# Patient Record
Sex: Male | Born: 1960 | Race: Black or African American | Hispanic: No | Marital: Single | Smoking: Current every day smoker
Health system: Southern US, Community
[De-identification: ages and names within clinical notes are randomized; demographics above are authoritative.]

## PROBLEM LIST (undated history)

## (undated) DIAGNOSIS — Z9289 Personal history of other medical treatment: Secondary | ICD-10-CM

## (undated) DIAGNOSIS — R011 Cardiac murmur, unspecified: Secondary | ICD-10-CM

## (undated) DIAGNOSIS — E119 Type 2 diabetes mellitus without complications: Secondary | ICD-10-CM

## (undated) DIAGNOSIS — D649 Anemia, unspecified: Secondary | ICD-10-CM

## (undated) HISTORY — PX: NO PAST SURGERIES: SHX2092

---

## 2003-10-20 ENCOUNTER — Emergency Department (HOSPITAL_COMMUNITY): Admission: AD | Admit: 2003-10-20 | Discharge: 2003-10-20 | Payer: Self-pay | Admitting: Family Medicine

## 2004-07-03 ENCOUNTER — Emergency Department (HOSPITAL_COMMUNITY): Admission: EM | Admit: 2004-07-03 | Discharge: 2004-07-03 | Payer: Self-pay | Admitting: Family Medicine

## 2016-04-14 ENCOUNTER — Encounter (HOSPITAL_COMMUNITY): Payer: Self-pay | Admitting: Emergency Medicine

## 2016-04-14 ENCOUNTER — Ambulatory Visit (HOSPITAL_COMMUNITY)
Admission: EM | Admit: 2016-04-14 | Discharge: 2016-04-14 | Disposition: A | Discharge status: Home / Self Care | Payer: BC Managed Care – PPO | Attending: Radiology | Admitting: Radiology

## 2016-04-14 DIAGNOSIS — M545 Low back pain, unspecified: Secondary | ICD-10-CM

## 2016-04-14 MED ORDER — DICLOFENAC SODIUM 75 MG PO TBEC: 75.0000 mg | DELAYED_RELEASE_TABLET | Freq: Two times a day (BID) | ORAL | 0 refills | Status: DC

## 2016-04-14 MED ORDER — CYCLOBENZAPRINE HCL 10 MG PO TABS: 10.0000 mg | ORAL_TABLET | Freq: Two times a day (BID) | ORAL | 0 refills | Status: AC | PRN

## 2016-04-14 NOTE — ED Triage Notes (Signed)
Pt c/o constant lower back pain onset 2 days  Reports he inj his back while trying to fix his car  Pain increases w/activity  Taking ibup w/no relief.   A&O x4... NAD

## 2016-04-14 NOTE — ED Provider Notes (Signed)
CSN: [id number]     Arrival date & time 04/14/16  1208 History   First MD Initiated Contact with Patient 04/14/16 1319     Chief Complaint  Patient presents with  . Back Pain   (Consider location/radiation/quality/duration/timing/severity/associated sxs/prior Treatment) 55 y.o. male presents with left sided lower back pain X 2 days.  Patient states that he injured his back while working on his car. Condition is acute on chronic in nature. Condition is made better by nothing Condition is made worse by movement. Patient denies any relief from OTC medications taken prior to there arrival at this facility. Patient states that he has a history of back pain. Patient denies any fevers or pain with urination or neurological deficit noted       History reviewed. No pertinent past medical history. History reviewed. No pertinent surgical history. History reviewed. No pertinent family history. Social History  Substance Use Topics  . Smoking status: Current Every Day Smoker  . Smokeless tobacco: Current User  . Alcohol use No    Review of Systems  Constitutional: Negative.   HENT: Negative.   Respiratory: Negative.   Cardiovascular: Negative.   Musculoskeletal: Positive for back pain ( left lower).    Allergies  Review of patient's allergies indicates no known allergies.  Home Medications   Prior to Admission medications   Not on File   Meds Ordered and Administered this Visit  Medications - No data to display  BP 144/84 (BP Location: Right Arm)   Pulse 114   Temp 97.8 F (36.6 C) (Oral)   Resp 16   SpO2 100%  No data found.   Physical Exam  Constitutional: He is oriented to person, place, and time. He appears well-developed and well-nourished.  Cardiovascular: Regular rhythm.   Pulmonary/Chest: Effort normal and breath sounds normal.  Musculoskeletal:  Left lower back pain worse with palpation  Neurological: He is alert and oriented to person, place, and time.     Urgent Care Course   Clinical Course    Procedures (including critical care time)  Labs Review Labs Reviewed - No data to display  Imaging Review No results found.   Visual Acuity Review  Right Eye Distance:   Left Eye Distance:   Bilateral Distance:    Right Eye Near:   Left Eye Near:    Bilateral Near:         MDM   1. Left-sided low back pain without sciatica      Jacqualine Mau, NP 04/14/16 1333

## 2016-04-14 NOTE — ED Notes (Signed)
Pt d/c by Anderson Malta, NP

## 2016-05-15 ENCOUNTER — Ambulatory Visit (HOSPITAL_COMMUNITY)
Admission: EM | Admit: 2016-05-15 | Discharge: 2016-05-15 | Disposition: A | Discharge status: Home / Self Care | Payer: BC Managed Care – PPO | Attending: Family Medicine | Admitting: Family Medicine

## 2016-05-15 ENCOUNTER — Encounter (HOSPITAL_COMMUNITY): Payer: Self-pay | Admitting: Emergency Medicine

## 2016-05-15 ENCOUNTER — Ambulatory Visit (INDEPENDENT_AMBULATORY_CARE_PROVIDER_SITE_OTHER): Payer: BC Managed Care – PPO

## 2016-05-15 DIAGNOSIS — M545 Low back pain: Secondary | ICD-10-CM | POA: Diagnosis not present

## 2016-05-15 DIAGNOSIS — T148 Other injury of unspecified body region: Secondary | ICD-10-CM

## 2016-05-15 DIAGNOSIS — M549 Dorsalgia, unspecified: Secondary | ICD-10-CM | POA: Diagnosis present

## 2016-05-15 DIAGNOSIS — Z79899 Other long term (current) drug therapy: Secondary | ICD-10-CM | POA: Insufficient documentation

## 2016-05-15 DIAGNOSIS — E119 Type 2 diabetes mellitus without complications: Secondary | ICD-10-CM | POA: Insufficient documentation

## 2016-05-15 DIAGNOSIS — M5136 Other intervertebral disc degeneration, lumbar region: Secondary | ICD-10-CM | POA: Insufficient documentation

## 2016-05-15 DIAGNOSIS — IMO0002 Reserved for concepts with insufficient information to code with codable children: Secondary | ICD-10-CM

## 2016-05-15 DIAGNOSIS — F17200 Nicotine dependence, unspecified, uncomplicated: Secondary | ICD-10-CM | POA: Insufficient documentation

## 2016-05-15 DIAGNOSIS — M4855XA Collapsed vertebra, not elsewhere classified, thoracolumbar region, initial encounter for fracture: Secondary | ICD-10-CM | POA: Diagnosis not present

## 2016-05-15 LAB — BASIC METABOLIC PANEL
ANION GAP: 11 (ref 5–15)
BUN: 19 mg/dL (ref 6–20)
CHLORIDE: 101 mmol/L (ref 101–111)
CO2: 26 mmol/L (ref 22–32)
Calcium: 10.4 mg/dL — ABNORMAL HIGH (ref 8.9–10.3)
Creatinine, Ser: 1.23 mg/dL (ref 0.61–1.24)
GFR calc Af Amer: >60 mL/min (ref >60)
GFR calc non Af Amer: >60 mL/min (ref >60)
GLUCOSE: 179 mg/dL — AB (ref 65–99)
POTASSIUM: 3.6 mmol/L (ref 3.5–5.1)
Sodium: 138 mmol/L (ref 135–145)

## 2016-05-15 LAB — POCT URINALYSIS DIP (DEVICE)
Glucose, UA: NEGATIVE mg/dL
HGB URINE DIPSTICK: NEGATIVE
KETONES UR: NEGATIVE mg/dL
Leukocytes, UA: NEGATIVE
Nitrite: NEGATIVE
PH: 5.5 (ref 5.0–8.0)
Protein, ur: 30 mg/dL — AB
SPECIFIC GRAVITY, URINE: 1.025 (ref 1.005–1.030)
Urobilinogen, UA: 4 mg/dL — ABNORMAL HIGH (ref 0.0–1.0)

## 2016-05-15 LAB — GLUCOSE, CAPILLARY: Glucose-Capillary: 171 mg/dL — ABNORMAL HIGH (ref 65–99)

## 2016-05-15 MED ORDER — OXYCODONE-ACETAMINOPHEN 10-325 MG PO TABS: 1.0000 | ORAL_TABLET | ORAL | 0 refills | Status: DC | PRN

## 2016-05-15 NOTE — ED Triage Notes (Signed)
The patient presented to the [facility] with a complaint of recurring lower back pain. The patient stated that he was treated at the [facility] on 04/14/2016 for the same.

## 2016-05-15 NOTE — ED Provider Notes (Signed)
CSN: [id number]     Arrival date & time 05/15/16  1403 History   First MD Initiated Contact with Patient 05/15/16 1508     Chief Complaint  Patient presents with  . Back Pain   (Consider location/radiation/quality/duration/timing/severity/associated sxs/prior Treatment) HPI 55 y/o male with chronic back pain, was seen in the UC one month ago, but continues to c/o pain.. No neuro symptoms. Does not have a PCP. Also asked about his diabetes. States he was taking medications but has been without meds by his report for about 2 years. States he has new weight loss and increase urination.  Past Medical History:  Diagnosis Date  . Diabetes mellitus without complication ([place])    History reviewed. No pertinent surgical history. History reviewed. No pertinent family history. Social History  Substance Use Topics  . Smoking status: Current Every Day Smoker  . Smokeless tobacco: Current User  . Alcohol use No    Review of Systems  Denies: HEADACHE, NAUSEA, ABDOMINAL PAIN, CHEST PAIN, CONGESTION, DYSURIA, SHORTNESS OF BREATH  Allergies  Review of patient's allergies indicates no known allergies.  Home Medications   Prior to Admission medications   Medication Sig Start Date End Date Taking? Authorizing Provider  cyclobenzaprine (FLEXERIL) 10 MG tablet Take 1 tablet (10 mg total) by mouth 2 (two) times daily as needed for muscle spasms. 04/14/16  Yes Jacqualine Mau, NP  diclofenac (VOLTAREN) 75 MG EC tablet Take 1 tablet (75 mg total) by mouth 2 (two) times daily. 04/14/16  Yes Jacqualine Mau, NP   Meds Ordered and Administered this Visit  Medications - No data to display  BP 128/69 (BP Location: Left Arm)   Pulse (!) 122 Comment: rn notified  Temp 98.4 F (36.9 C)   Resp 14   SpO2 100%  No data found.   Physical Exam NURSES NOTES AND VITAL SIGNS REVIEWED. CONSTITUTIONAL: Well developed, well nourished, no acute distress HEENT: normocephalic, atraumatic EYES:  Conjunctiva normal NECK:normal ROM, supple, no adenopathy PULMONARY:No respiratory distress, normal effort ABDOMINAL: Soft, ND, NT BS+, No CVAT MUSCULOSKELETAL: Normal ROM of all extremities, diffuse tenderness along lumbar and thoracic spine. Neg SLR. Sensory and motor intact distally.  SKIN: warm and dry without rash PSYCHIATRIC: Mood and affect, behavior are normal  Urgent Care Course   Clinical Course    Procedures (including critical care time)  Labs Review Labs Reviewed  POCT URINALYSIS DIP (DEVICE) - Abnormal; Notable for the following:       Result Value   Bilirubin Urine SMALL (*)    Protein, ur 30 (*)    Urobilinogen, UA 4.0 (*)    All other components within normal limits  HEMOGLOBIN [id number]  BASIC METABOLIC PANEL    Imaging Review Dg Lumbar Spine Complete  Result Date: 05/15/2016 CLINICAL DATA:  Patient with lower back pain after twisting injury 1 month prior. Initial encounter. EXAM: LUMBAR SPINE - COMPLETE 4+ VIEW COMPARISON:  None. FINDINGS: Multilevel degenerative disc disease with bulky anterior endplate osteophytosis. Multilevel facet degenerative changes. Age-indeterminate compression deformities of the superior endplate of the L1 and 624THL vertebral bodies. SI joints are unremarkable. Stool throughout the colon. IMPRESSION: Multilevel degenerative disc and facet disease. Age-indeterminate superior endplate compression deformities of the T12 and L1 vertebral bodies. Recommend correlation for point tenderness. These results will be called to the ordering clinician or representative by the Radiologist Assistant, and communication documented in the PACS or zVision Dashboard. Electronically Signed   By: Lovey Newcomer M.D.   On: 05/15/2016  15:54   Discussed with pt at discharge.   Visual Acuity Review  Right Eye Distance:   Left Eye Distance:   Bilateral Distance:    Right Eye Near:   Left Eye Near:    Bilateral Near:       Ortho referral   MDM   1. Low back  pain without sciatica, unspecified back pain laterality   2. Compression fracture     Patient is reassured that there are no issues that require transfer to higher level of care at this time or additional tests. Patient is advised to continue home symptomatic treatment. Patient is advised that if there are new or worsening symptoms to attend the emergency department, contact primary care provider, or return to UC. Instructions of care provided discharged home in stable condition.    THIS NOTE WAS GENERATED USING A VOICE RECOGNITION SOFTWARE PROGRAM. ALL REASONABLE EFFORTS  WERE MADE TO PROOFREAD THIS DOCUMENT FOR ACCURACY.  I have verbally reviewed the discharge instructions with the patient. A printed AVS was given to the patient.  All questions were answered prior to discharge.      Konrad Felix, PA 05/16/16 1921

## 2016-05-16 LAB — HEMOGLOBIN A1C
HEMOGLOBIN A1C: 6.7 % — AB (ref 4.8–5.6)
MEAN PLASMA GLUCOSE: 146 mg/dL

## 2016-05-28 ENCOUNTER — Telehealth (HOSPITAL_COMMUNITY): Payer: Self-pay | Admitting: Emergency Medicine

## 2016-05-28 NOTE — Telephone Encounter (Signed)
-----   Message from Sherlene Shams, MD sent at 05/23/2016  2:44 PM EDT ----- Clinical staff, please let patient know that HgbA1c was slightly elevated at 6.7.   Followup with a primary care provider to discuss further management of elevated blood glucose and HbA1c.  LM

## 2016-05-28 NOTE — Telephone Encounter (Signed)
LM on pt's VM Need to see how pt is doing and to give lab results from recent visit on 9/26 Also let pt know labs can be obtained from [facility]

## 2016-05-29 ENCOUNTER — Emergency Department (HOSPITAL_COMMUNITY): Payer: BC Managed Care – PPO

## 2016-05-29 ENCOUNTER — Inpatient Hospital Stay (HOSPITAL_COMMUNITY)
Admission: EM | Admit: 2016-05-29 | Discharge: 2016-06-02 | DRG: 723 | Disposition: A | Discharge status: Home Health | Payer: BC Managed Care – PPO | Attending: Internal Medicine | Admitting: Internal Medicine

## 2016-05-29 ENCOUNTER — Inpatient Hospital Stay (HOSPITAL_COMMUNITY): Payer: BC Managed Care – PPO

## 2016-05-29 ENCOUNTER — Encounter (HOSPITAL_COMMUNITY): Payer: Self-pay | Admitting: Emergency Medicine

## 2016-05-29 DIAGNOSIS — Z79899 Other long term (current) drug therapy: Secondary | ICD-10-CM | POA: Diagnosis not present

## 2016-05-29 DIAGNOSIS — Z9289 Personal history of other medical treatment: Secondary | ICD-10-CM

## 2016-05-29 DIAGNOSIS — J9811 Atelectasis: Secondary | ICD-10-CM | POA: Diagnosis present

## 2016-05-29 DIAGNOSIS — D649 Anemia, unspecified: Secondary | ICD-10-CM | POA: Diagnosis not present

## 2016-05-29 DIAGNOSIS — D630 Anemia in neoplastic disease: Secondary | ICD-10-CM | POA: Diagnosis present

## 2016-05-29 DIAGNOSIS — R61 Generalized hyperhidrosis: Secondary | ICD-10-CM | POA: Diagnosis not present

## 2016-05-29 DIAGNOSIS — R739 Hyperglycemia, unspecified: Secondary | ICD-10-CM

## 2016-05-29 DIAGNOSIS — M549 Dorsalgia, unspecified: Secondary | ICD-10-CM | POA: Diagnosis present

## 2016-05-29 DIAGNOSIS — C801 Malignant (primary) neoplasm, unspecified: Secondary | ICD-10-CM | POA: Diagnosis not present

## 2016-05-29 DIAGNOSIS — F17200 Nicotine dependence, unspecified, uncomplicated: Secondary | ICD-10-CM | POA: Diagnosis present

## 2016-05-29 DIAGNOSIS — M8458XA Pathological fracture in neoplastic disease, other specified site, initial encounter for fracture: Secondary | ICD-10-CM | POA: Diagnosis present

## 2016-05-29 DIAGNOSIS — C7951 Secondary malignant neoplasm of bone: Secondary | ICD-10-CM | POA: Diagnosis present

## 2016-05-29 DIAGNOSIS — R748 Abnormal levels of other serum enzymes: Secondary | ICD-10-CM | POA: Diagnosis present

## 2016-05-29 DIAGNOSIS — R17 Unspecified jaundice: Secondary | ICD-10-CM | POA: Diagnosis not present

## 2016-05-29 DIAGNOSIS — N390 Urinary tract infection, site not specified: Secondary | ICD-10-CM | POA: Diagnosis present

## 2016-05-29 DIAGNOSIS — C799 Secondary malignant neoplasm of unspecified site: Secondary | ICD-10-CM | POA: Diagnosis not present

## 2016-05-29 DIAGNOSIS — M545 Low back pain: Secondary | ICD-10-CM | POA: Diagnosis present

## 2016-05-29 DIAGNOSIS — C775 Secondary and unspecified malignant neoplasm of intrapelvic lymph nodes: Secondary | ICD-10-CM | POA: Diagnosis not present

## 2016-05-29 DIAGNOSIS — Z833 Family history of diabetes mellitus: Secondary | ICD-10-CM

## 2016-05-29 DIAGNOSIS — E876 Hypokalemia: Secondary | ICD-10-CM | POA: Diagnosis present

## 2016-05-29 DIAGNOSIS — R8271 Bacteriuria: Secondary | ICD-10-CM

## 2016-05-29 DIAGNOSIS — Z6829 Body mass index (BMI) 29.0-29.9, adult: Secondary | ICD-10-CM

## 2016-05-29 DIAGNOSIS — C779 Secondary and unspecified malignant neoplasm of lymph node, unspecified: Secondary | ICD-10-CM | POA: Diagnosis present

## 2016-05-29 DIAGNOSIS — E1165 Type 2 diabetes mellitus with hyperglycemia: Secondary | ICD-10-CM | POA: Diagnosis present

## 2016-05-29 DIAGNOSIS — R634 Abnormal weight loss: Secondary | ICD-10-CM | POA: Diagnosis present

## 2016-05-29 DIAGNOSIS — C7949 Secondary malignant neoplasm of other parts of nervous system: Secondary | ICD-10-CM | POA: Diagnosis present

## 2016-05-29 DIAGNOSIS — M899 Disorder of bone, unspecified: Secondary | ICD-10-CM | POA: Diagnosis not present

## 2016-05-29 DIAGNOSIS — C61 Malignant neoplasm of prostate: Secondary | ICD-10-CM | POA: Diagnosis present

## 2016-05-29 DIAGNOSIS — I10 Essential (primary) hypertension: Secondary | ICD-10-CM | POA: Diagnosis present

## 2016-05-29 DIAGNOSIS — E119 Type 2 diabetes mellitus without complications: Secondary | ICD-10-CM | POA: Diagnosis not present

## 2016-05-29 DIAGNOSIS — F17210 Nicotine dependence, cigarettes, uncomplicated: Secondary | ICD-10-CM

## 2016-05-29 HISTORY — DX: Personal history of other medical treatment: Z92.89

## 2016-05-29 HISTORY — DX: Cardiac murmur, unspecified: R01.1

## 2016-05-29 HISTORY — DX: Anemia, unspecified: D64.9

## 2016-05-29 HISTORY — DX: Type 2 diabetes mellitus without complications: E11.9

## 2016-05-29 LAB — PSA: PSA: 1471 ng/mL — ABNORMAL HIGH (ref 0.00–4.00)

## 2016-05-29 LAB — PREPARE RBC (CROSSMATCH)

## 2016-05-29 LAB — I-STAT VENOUS BLOOD GAS, ED
Acid-Base Excess: 1 mmol/L (ref 0.0–2.0)
BICARBONATE: 25 mmol/L (ref 20.0–28.0)
O2 SAT: 96 %
PCO2 VEN: 34.9 mmHg — AB (ref 44.0–60.0)
PO2 VEN: 79 mmHg — AB (ref 32.0–45.0)
TCO2: 26 mmol/L (ref 0–100)
pH, Ven: 7.464 — ABNORMAL HIGH (ref 7.250–7.430)

## 2016-05-29 LAB — URINALYSIS, ROUTINE W REFLEX MICROSCOPIC
Glucose, UA: NEGATIVE mg/dL
HGB URINE DIPSTICK: NEGATIVE
Ketones, ur: 15 mg/dL — AB
Leukocytes, UA: NEGATIVE
NITRITE: POSITIVE — AB
Protein, ur: 30 mg/dL — AB
SPECIFIC GRAVITY, URINE: 1.017 (ref 1.005–1.030)
pH: 5.5 (ref 5.0–8.0)

## 2016-05-29 LAB — CBC WITH DIFFERENTIAL/PLATELET
BASOS ABS: 0.2 10*3/uL — AB (ref 0.0–0.1)
BASOS ABS: 0 10*3/uL (ref 0.0–0.1)
BASOS PCT: 1 %
BLASTS: 0 %
Band Neutrophils: 0 %
Basophils Relative: 0 %
EOS PCT: 0 %
Eosinophils Absolute: 0 10*3/uL (ref 0.0–0.7)
Eosinophils Absolute: 0.2 10*3/uL (ref 0.0–0.7)
Eosinophils Relative: 1 %
HEMATOCRIT: 15.3 % — AB (ref 39.0–52.0)
HEMATOCRIT: 14.7 % — AB (ref 39.0–52.0)
HEMOGLOBIN: 4.6 g/dL — AB (ref 13.0–17.0)
HEMOGLOBIN: 4.4 g/dL — AB (ref 13.0–17.0)
LYMPHS ABS: 4 10*3/uL (ref 0.7–4.0)
LYMPHS PCT: 27 %
Lymphocytes Relative: 26 %
Lymphs Abs: 4.3 10*3/uL — ABNORMAL HIGH (ref 0.7–4.0)
MCH: 27.5 pg (ref 26.0–34.0)
MCH: 27.5 pg (ref 26.0–34.0)
MCHC: 30.1 g/dL (ref 30.0–36.0)
MCHC: 29.9 g/dL — ABNORMAL LOW (ref 30.0–36.0)
MCV: 91.6 fL (ref 78.0–100.0)
MCV: 91.9 fL (ref 78.0–100.0)
METAMYELOCYTES PCT: 0 %
MONOS PCT: 5 %
MYELOCYTES: 0 %
Monocytes Absolute: 0.8 10*3/uL (ref 0.1–1.0)
Monocytes Absolute: 0.5 10*3/uL (ref 0.1–1.0)
Monocytes Relative: 3 %
NEUTROS ABS: 10.5 10*3/uL — AB (ref 1.7–7.7)
Neutro Abs: 10.5 10*3/uL — ABNORMAL HIGH (ref 1.7–7.7)
Neutrophils Relative %: 67 %
Neutrophils Relative %: 70 %
Other: 0 %
Platelets: 157 10*3/uL (ref 150–400)
Platelets: 142 10*3/uL — ABNORMAL LOW (ref 150–400)
Promyelocytes Absolute: 0 %
RBC: 1.67 MIL/uL — ABNORMAL LOW (ref 4.22–5.81)
RBC: 1.6 MIL/uL — AB (ref 4.22–5.81)
RDW: 16.5 % — ABNORMAL HIGH (ref 11.5–15.5)
RDW: 16.7 % — AB (ref 11.5–15.5)
WBC: 15.8 10*3/uL — ABNORMAL HIGH (ref 4.0–10.5)
WBC: 15.2 10*3/uL — AB (ref 4.0–10.5)
nRBC: 12 /100 WBC — ABNORMAL HIGH

## 2016-05-29 LAB — URINE MICROSCOPIC-ADD ON: WBC UA: NONE SEEN WBC/hpf (ref 0–5)

## 2016-05-29 LAB — COMPREHENSIVE METABOLIC PANEL
ALBUMIN: 2.8 g/dL — AB (ref 3.5–5.0)
ALT: 13 U/L — ABNORMAL LOW (ref 17–63)
ANION GAP: 12 (ref 5–15)
AST: 15 U/L (ref 15–41)
Alkaline Phosphatase: 1156 U/L — ABNORMAL HIGH (ref 38–126)
BUN: 15 mg/dL (ref 6–20)
CHLORIDE: 98 mmol/L — AB (ref 101–111)
CO2: 25 mmol/L (ref 22–32)
Calcium: 8.7 mg/dL — ABNORMAL LOW (ref 8.9–10.3)
Creatinine, Ser: 1.23 mg/dL (ref 0.61–1.24)
GFR calc Af Amer: >60 mL/min (ref >60)
GFR calc non Af Amer: >60 mL/min (ref >60)
GLUCOSE: 205 mg/dL — AB (ref 65–99)
POTASSIUM: 3.4 mmol/L — AB (ref 3.5–5.1)
SODIUM: 135 mmol/L (ref 135–145)
TOTAL PROTEIN: 6.9 g/dL (ref 6.5–8.1)
Total Bilirubin: 1.7 mg/dL — ABNORMAL HIGH (ref 0.3–1.2)

## 2016-05-29 LAB — RETICULOCYTES
RBC.: 1.55 MIL/uL — AB (ref 4.22–5.81)
RETIC CT PCT: 8.8 % — AB (ref 0.4–3.1)
Retic Count, Absolute: 136.4 10*3/uL (ref 19.0–186.0)

## 2016-05-29 LAB — RAPID HIV SCREEN (HIV 1/2 AB+AG)
HIV 1/2 ANTIBODIES: NONREACTIVE
HIV-1 P24 ANTIGEN - HIV24: NONREACTIVE

## 2016-05-29 LAB — IRON AND TIBC
IRON: 74 ug/dL (ref 45–182)
SATURATION RATIOS: 32 % (ref 17.9–39.5)
TIBC: 228 ug/dL — AB (ref 250–450)
UIBC: 154 ug/dL

## 2016-05-29 LAB — VITAMIN B12: Vitamin B-12: 298 pg/mL (ref 180–914)

## 2016-05-29 LAB — I-STAT CG4 LACTIC ACID, ED: LACTIC ACID, VENOUS: 2.29 mmol/L — AB (ref 0.5–1.9)

## 2016-05-29 LAB — ABO/RH: ABO/RH(D): B POS

## 2016-05-29 LAB — LACTATE DEHYDROGENASE: LDH: 231 U/L — ABNORMAL HIGH (ref 98–192)

## 2016-05-29 LAB — LACTIC ACID, PLASMA: LACTIC ACID, VENOUS: 1.5 mmol/L (ref 0.5–1.9)

## 2016-05-29 LAB — FOLATE: Folate: 4.6 ng/mL — ABNORMAL LOW (ref >5.9)

## 2016-05-29 LAB — POC OCCULT BLOOD, ED: Fecal Occult Bld: NEGATIVE

## 2016-05-29 LAB — SAVE SMEAR

## 2016-05-29 LAB — LIPASE, BLOOD: LIPASE: 21 U/L (ref 11–51)

## 2016-05-29 LAB — GLUCOSE, CAPILLARY: Glucose-Capillary: 176 mg/dL — ABNORMAL HIGH (ref 65–99)

## 2016-05-29 MED ORDER — ACETAMINOPHEN 325 MG PO TABS: 650.0000 mg | ORAL_TABLET | Freq: Four times a day (QID) | ORAL | Status: DC | PRN

## 2016-05-29 MED ORDER — SODIUM CHLORIDE 0.9% FLUSH
3.0000 mL | Freq: Two times a day (BID) | INTRAVENOUS | Status: DC
  Administered 2016-05-29 – 2016-06-02 (×7): 3 mL via INTRAVENOUS

## 2016-05-29 MED ORDER — DEXTROSE 5 % IV SOLN
1.0000 g | Freq: Once | INTRAVENOUS | Status: AC
  Administered 2016-05-29: 1 g via INTRAVENOUS
  Filled 2016-05-29: qty 10

## 2016-05-29 MED ORDER — SODIUM CHLORIDE 0.9 % IV SOLN: 10.0000 mL/h | Freq: Once | INTRAVENOUS | Status: DC

## 2016-05-29 MED ORDER — SODIUM CHLORIDE 0.9 % IV SOLN
Freq: Once | INTRAVENOUS | Status: AC
  Administered 2016-05-29: 17:00:00 via INTRAVENOUS

## 2016-05-29 MED ORDER — IOPAMIDOL (ISOVUE-300) INJECTION 61%
INTRAVENOUS | Status: AC
  Administered 2016-05-29: 100 mL
  Filled 2016-05-29: qty 100

## 2016-05-29 MED ORDER — ACETAMINOPHEN 650 MG RE SUPP: 650.0000 mg | Freq: Four times a day (QID) | RECTAL | Status: DC | PRN

## 2016-05-29 MED ORDER — SODIUM CHLORIDE 0.9 % IV BOLUS (SEPSIS)
1000.0000 mL | Freq: Once | INTRAVENOUS | Status: AC
  Administered 2016-05-29: 1000 mL via INTRAVENOUS

## 2016-05-29 MED ORDER — POTASSIUM CHLORIDE CRYS ER 20 MEQ PO TBCR
20.0000 meq | EXTENDED_RELEASE_TABLET | Freq: Once | ORAL | Status: AC
  Administered 2016-05-29: 20 meq via ORAL
  Filled 2016-05-29: qty 1

## 2016-05-29 MED ORDER — DEXTROSE 5 % IV SOLN
1.0000 g | INTRAVENOUS | Status: DC
  Administered 2016-05-30 – 2016-05-31 (×2): 1 g via INTRAVENOUS
  Filled 2016-05-29 (×5): qty 10

## 2016-05-29 MED ORDER — ENSURE ENLIVE PO LIQD
237.0000 mL | Freq: Two times a day (BID) | ORAL | Status: DC
  Administered 2016-05-30 – 2016-06-02 (×6): 237 mL via ORAL

## 2016-05-29 MED ORDER — HYDROCODONE-ACETAMINOPHEN 5-325 MG PO TABS
1.0000 | ORAL_TABLET | ORAL | Status: DC | PRN
Start: 2016-05-29 — End: 2016-06-02
  Administered 2016-05-31 – 2016-06-02 (×5): 2 via ORAL
  Filled 2016-05-29 (×5): qty 2

## 2016-05-29 NOTE — H&P (Signed)
Date: 05/29/2016               Patient Name:  Cameron Marshall MRN: [id number]  DOB: 02/28/1961 Age / Sex: 56 y.o., male   PCP: Pcp Not In System         Medical Service: Internal Medicine Teaching Service         Attending Physician: Dr. Sid Falcon, MD    First Contact: Dr. Reesa Chew Pager: [phone]  Second Contact: Dr. Juleen China Pager: [phone]       After Hours (After 5p/  First Contact Pager: [phone]  weekends / holidays): Second Contact Pager: [phone]   Chief Complaint: Lower back pain for 3 weeks.  History of Present Illness: Cameron Marshall 55 y.o man, without any significant past medical history, presented to the history of worsening lower back pain. He states that pain started about 3 weeks ago when he was putting some heavy stuff in his truck. He attributes that pain to be a muscle pull, never took any medical attention at that time. He was seen in an urgent care on September 26, because of his back pain, lumbar spinal x-ray done during that time shows multilevel degenerative disc and facet disease with compression deformities of the T12 and L1. He was seen at an urgent care on August 26, with left-sided lower back pain radiating to his leg, and was given some Flexeril and diclofenac, no imaging performed during that time. He endorse some night sweats and subjective fever for the last 3-4 days. He denies any nausea and vomiting, but states that he come nauseated after taking Percocet. He do endorse decrease in his appetite and unintentional weight loss of about 30 pounds over the last 2-3 month. He states that he has notice mild reddish discoloration of his urine for the last few  months. He has some difficulty with urination, poor stream, hesitancy and nocturia for about a year. He denies any change in his bowel movement or blood in his stool. He denies any shortness of breath, palpitation, or chest pain. He denies any headaches or change in vision.   Meds:  Current Meds    Medication Sig  . cyclobenzaprine (FLEXERIL) 10 MG tablet Take 1 tablet (10 mg total) by mouth 2 (two) times daily as needed for muscle spasms.  . diclofenac (VOLTAREN) 75 MG EC tablet Take 1 tablet (75 mg total) by mouth 2 (two) times daily.     Allergies: Allergies as of 05/29/2016  . (No Known Allergies)   Past Medical History:  Diagnosis Date  . Diabetes mellitus without complication ([place])     Family History: Mother is diabetic.  Social History: Smoker since the age 68, 30 pack/wk                          Denies any alcohol or illicit drug use.  Review of Systems: A complete ROS was negative except as per HPI.   Physical Exam: Blood pressure 135/70, pulse 109, temperature 98.3 F (36.8 C), temperature source Oral, resp. rate 18, height '5\' 9"'  (1.753 m), SpO2 100 %. Vitals:   05/29/16 1100 05/29/16 1115 05/29/16 1130 05/29/16 1145  BP: 132/64 135/65 (!) 138/127 135/70  Pulse: (!) 122 112 97 109  Resp: '14 22 19 18  ' Temp:      TempSrc:      SpO2: 99% 100% 100% 100%  Height:       General: Vital signs reviewed.  Patient is well-developed and well-nourished, in no acute distress and cooperative with exam.  Head: Normocephalic and atraumatic. Eyes: EOMI, conjunctival Pallor, mild scleral icterus.  Neck: Supple, trachea midline, normal ROM, no JVD, masses, thyromegaly, or carotid bruit present.  Cardiovascular: RRR, S1 normal, S2 normal, no murmurs, gallops, or rubs. Pulmonary/Chest: Clear to auscultation bilaterally, no wheezes, rales, or rhonchi. Abdominal: Soft, non-tender, non-distended, BS +, no masses, organomegaly, or guarding present.  Rectal. Enlarged prostate, no nodularity noted. Rectal tone was normal. Musculoskeletal: No joint deformities, erythema, or stiffness, ROM full and nontender. Extremities: No lower extremity edema bilaterally,  pulses symmetric and intact bilaterally. No cyanosis or clubbing. Neurological: A&O x3, Strength is normal and symmetric  bilaterally, cranial nerve II-XII are grossly intact, no focal motor deficit, sensory intact to light touch bilaterally.  Skin: Warm, dry and intact. No rashes or erythema. Psychiatric: Normal mood and affect. speech and behavior is normal. Cognition and memory are normal.   Labs. CBC    Component Value Date/Time   WBC 15.2 (H) 05/29/2016 1221   RBC 1.55 (L) 05/29/2016 1513   RBC 1.60 (L) 05/29/2016 1221   HGB 4.4 (LL) 05/29/2016 1221   HCT 14.7 (L) 05/29/2016 1221   PLT 142 (L) 05/29/2016 1221   MCV 91.9 05/29/2016 1221   MCH 27.5 05/29/2016 1221   MCHC 29.9 (L) 05/29/2016 1221   RDW 16.7 (H) 05/29/2016 1221   LYMPHSABS 4.0 05/29/2016 1221   MONOABS 0.5 05/29/2016 1221   EOSABS 0.2 05/29/2016 1221   BASOSABS 0.0 05/29/2016 1221   CMP Latest Ref Rng & Units 05/29/2016 05/15/2016  Glucose 65 - 99 mg/dL 205(H) 179(H)  BUN 6 - 20 mg/dL 15 19  Creatinine 0.61 - 1.24 mg/dL 1.23 1.23  Sodium 135 - 145 mmol/L 135 138  Potassium 3.5 - 5.1 mmol/L 3.4(L) 3.6  Chloride 101 - 111 mmol/L 98(L) 101  CO2 22 - 32 mmol/L 25 26  Calcium 8.9 - 10.3 mg/dL 8.7(L) 10.4(H)  Total Protein 6.5 - 8.1 g/dL 6.9 -  Total Bilirubin 0.3 - 1.2 mg/dL 1.7(H) -  Alkaline Phos 38 - 126 U/L 1,156(H) -  AST 15 - 41 U/L 15 -  ALT 17 - 63 U/L 13(L) -   Retic Ct Pct.  8.8  FOBT. Negative  Lipase  21  Urinalysis    Component Value Date/Time   COLORURINE ORANGE (A) 05/29/2016 1138   APPEARANCEUR HAZY (A) 05/29/2016 1138   LABSPEC 1.017 05/29/2016 1138   PHURINE 5.5 05/29/2016 1138   GLUCOSEU NEGATIVE 05/29/2016 1138   HGBUR NEGATIVE 05/29/2016 1138   BILIRUBINUR SMALL (A) 05/29/2016 1138   KETONESUR 15 (A) 05/29/2016 1138   PROTEINUR 30 (A) 05/29/2016 1138   UROBILINOGEN 4.0 (H) 05/15/2016 1532   NITRITE POSITIVE (A) 05/29/2016 1138   LEUKOCYTESUR NEGATIVE 05/29/2016 1138    CT lumbar spine wo contrast. IMPRESSION: 1. Diffusely abnormal visible spine with pathologic fractures throughout the  visible lower thoracic spine and involving L1 and L2. Lytic lesions in the L4 and L5 vertebral bodies. Mixed lytic and sclerotic lesions throughout the visible sacrum. 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 2. Bulky prevertebral, retroperitoneal, and left pelvic sidewall lymphadenopathy/tumor. Partially visible prostate appears enlarged. The partially visible kidneys do not appear obstructed. 3. 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 The leading differential considerations are Lymphoma, Prostate cancer, and less likely Multiple Myeloma or other primary. Follow-up CT Chest Abdomen and Pelvis with IV and oral contrast  probably is the best next step to evaluate further.    Assessment & Plan by Problem:  Cameron Marshall 54 y.o man, without any significant past medical history, presented to the history of worsening lower back pain.  Malignancy. 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 Primary unknown at this time. Lymphoma and prostate cancer among our top differentials. Multiple myeloma or colon cancer can be a possibility, but less likely. His FOBT is negative and he do not have any family history. -Peripheral smear exam. -PSA -Kappa/Lamda light chains -Immunofixation electrophoresis. -Protein electrophoresis. -CT of his chest ,abdomen and pelvis with contrast. -Bone marrow if needed.  Anemia. He was found to have hemoglobin of 4.4. He denies any shortness of breath, exertional dyspnea, palpitation or chest pain. Most probably due to his malignancy, possible lymphoma, as his RBC and platelets both are decreased and he is having leukocytosis of 15.2. -Packed RBC -2 units -Repeat CBC in the morning.  UTI. He do complained of some dark-colored urine, but denies any dysuria or burning. He has some urgency and hesitancy and difficulty with initiation. His  urine analysis was positive for nitrates. -Culture and sensitivity -Rocephin 1 g daily -We will modify according to his sensitivity report.  High blood sugar. He was found to have mildly elevated blood sugars during the his recent visit to urgent care in ED.A1c done on 05/15/16 was 6.7. -CBG every 4 hourly. -We will add sliding scale if his blood sugar persistently stays above 140.  Hypokalemia. His potassium was 3.4. We will replace it. -Repeat BMP tomorrow  Code Status. Full DVT. SCD Diet. Low-carb.  Dispo: Admit patient to Inpatient with expected length of stay greater than 2 midnights.  Signed: Lorella Nimrod, MD 05/29/2016, 1:56 PM  Pager: [phone]

## 2016-05-29 NOTE — ED Triage Notes (Addendum)
Pt reports back pain for 2 weeks. Take oxy 1 Q 4 and no relief. Never seen MD for it. Lower back. Primary care MD says he has "some stuff that's not aligned". Having difficulty ambulating due to pain. Pulse is high.

## 2016-05-29 NOTE — ED Notes (Signed)
Blood consent signed

## 2016-05-29 NOTE — ED Provider Notes (Signed)
[facility] DEPT Provider Note   CSN: [id number] Arrival date & time: 05/29/16  7673     History   Chief Complaint Chief Complaint  Patient presents with  . Back Pain    HPI Cameron Marshall is a 55 y.o. male.  HPI  Patient is a 55 year old male presenting with back pain, weight loss, and nausea and decreased appetite and dark urine.. Patient has lost about 40 pounds last several months. He's been having increasing aching in his back. He's been to urgent care twice for back pain and was found to have compression fractures of unknown time. Patient has history of diabetes does not take any medications.  Has no follow-up, no PCP.  Past Medical History:  Diagnosis Date  . Diabetes mellitus without complication [facility])     Patient Active Problem List   Diagnosis Date Noted  . Anemia 05/29/2016  . Bone lesion 05/29/2016  . Elevated serum alkaline phosphatase level 05/29/2016  . Urinary tract infection 05/29/2016    History reviewed. No pertinent surgical history.     Home Medications    Prior to Admission medications   Medication Sig Start Date End Date Taking? Authorizing Provider  cyclobenzaprine (FLEXERIL) 10 MG tablet Take 1 tablet (10 mg total) by mouth 2 (two) times daily as needed for muscle spasms. 04/14/16  Yes Jacqualine Mau, NP  diclofenac (VOLTAREN) 75 MG EC tablet Take 1 tablet (75 mg total) by mouth 2 (two) times daily. 04/14/16  Yes Jacqualine Mau, NP  oxyCODONE-acetaminophen (PERCOCET) 10-325 MG tablet Take 1 tablet by mouth every 4 (four) hours as needed for pain. Patient not taking: Reported on 05/29/2016 05/15/16   Konrad Felix, PA    Family History History reviewed. No pertinent family history.  Social History Social History  Substance Use Topics  . Smoking status: Current Every Day Smoker    Packs/day: 1.00  . Smokeless tobacco: Current User  . Alcohol use No     Allergies   Review of patient's allergies indicates no known  allergies.   Review of Systems Review of Systems  Constitutional: Positive for appetite change. Negative for activity change.  Respiratory: Negative for shortness of breath.   Cardiovascular: Negative for chest pain.  Gastrointestinal: Positive for nausea. Negative for abdominal pain and vomiting.  Genitourinary: Positive for decreased urine volume. Negative for dysuria.  Musculoskeletal: Positive for back pain.  All other systems reviewed and are negative.    Physical Exam Updated Vital Signs BP 135/70   Pulse 109   Temp 98.3 F (36.8 C) (Oral)   Resp 18   Ht _0  (1.753 m)   SpO2 100%   Physical Exam  Constitutional: He is oriented to person, place, and time. He appears well-nourished.  HENT:  Head: Normocephalic.  Mild icterus.  Eyes: Conjunctivae are normal.  Cardiovascular:  Tachycardia and no murmurs.  Pulmonary/Chest: Effort normal and breath sounds normal.  Abdominal: He exhibits no distension. There is no tenderness.  Musculoskeletal:  Tenderness along L-spine.  Neurological: He is oriented to person, place, and time.  Skin: Skin is warm and dry. He is not diaphoretic.  Psychiatric: He has a normal mood and affect. His behavior is normal.     ED Treatments / Results  Labs (all labs ordered are listed, but only abnormal results are displayed) Labs Reviewed  COMPREHENSIVE METABOLIC PANEL - Abnormal; Notable for the following:       Result Value   Potassium 3.4 (*)    Chloride  98 (*)    Glucose, Bld 205 (*)    Calcium 8.7 (*)    Albumin 2.8 (*)    ALT 13 (*)    Alkaline Phosphatase 1,156 (*)    Total Bilirubin 1.7 (*)    All other components within normal limits  CBC WITH DIFFERENTIAL/PLATELET - Abnormal; Notable for the following:    WBC 15.8 (*)    RBC 1.67 (*)    Hemoglobin 4.6 (*)    HCT 15.3 (*)    RDW 16.5 (*)    Neutro Abs 10.5 (*)    Lymphs Abs 4.3 (*)    Basophils Absolute 0.2 (*)    All other components within normal limits    URINALYSIS, ROUTINE W REFLEX MICROSCOPIC (NOT AT [facility]) - Abnormal; Notable for the following:    Color, Urine ORANGE (*)    APPearance HAZY (*)    Bilirubin Urine SMALL (*)    Ketones, ur 15 (*)    Protein, ur 30 (*)    Nitrite POSITIVE (*)    All other components within normal limits  CBC WITH DIFFERENTIAL/PLATELET - Abnormal; Notable for the following:    WBC 15.2 (*)    RBC 1.60 (*)    Hemoglobin 4.4 (*)    HCT 14.7 (*)    MCHC 29.9 (*)    RDW 16.7 (*)    Platelets 142 (*)    All other components within normal limits  URINE MICROSCOPIC-ADD ON - Abnormal; Notable for the following:    Squamous Epithelial / LPF 0-5 (*)    Bacteria, UA RARE (*)    Casts HYALINE CASTS (*)    All other components within normal limits  I-STAT CG4 LACTIC ACID, ED - Abnormal; Notable for the following:    Lactic Acid, Venous 2.29 (*)    All other components within normal limits  I-STAT VENOUS BLOOD GAS, ED - Abnormal; Notable for the following:    pH, Ven 7.464 (*)    pCO2, Ven 34.9 (*)    pO2, Ven 79.0 (*)    All other components within normal limits  URINE CULTURE  LIPASE, BLOOD  IRON AND TIBC  RAPID HIV SCREEN (HIV 1/2 AB+AG)  TYPE AND SCREEN  PREPARE RBC (CROSSMATCH)    EKG  EKG Interpretation None       Radiology Ct Lumbar Spine Wo Contrast  Result Date: 05/29/2016 CLINICAL DATA:  55 year old male with back pain, unintentional weight loss nausea, dark urine. Initial encounter. Age indeterminate compression fractures on September lumbar radiographs. EXAM: CT LUMBAR SPINE WITHOUT CONTRAST TECHNIQUE: Multidetector CT imaging of the lumbar spine was performed without intravenous contrast administration. Multiplanar CT image reconstructions were also generated. COMPARISON:  Lumbar radiographs 05/15/2016. FINDINGS: Segmentation: Normal. Alignment: Overall stable vertebral height alignment since September. Straightening of lumbar lordosis. Vertebrae: Severely abnormal bone mineralization  throughout the visible spine, consisting of a permeative pattern of destruction in the lower thoracic and upper lumbar levels. Lytic lesion throughout the central L4 vertebral body. Lytic lesion throughout the inferior L5 vertebral body,. Mixed lytic and miliary sclerotic lesions in the visible sacrum and medial iliac bones. Pathologic appearing vertebral body superior endplate fractures of E45, T12, L1, and L2. There also appears to be a pathologic fracture of the left T11 pars and lamina as seen on series 7, image 38. Mild loss of vertebral body height throughout the lower thoracic spine to L2. There could be mild epidural tumor ventrally throughout much of the lower thoracic and lumbar  spine. Paraspinal and other soft tissues: Bulky abnormal prevertebral and paraspinal soft tissue appearing contiguous from the lower thoracic spine through the lumbar spine, and maximal in the mid to lower lumbar spine where confluent soft tissue tumor anterior to L4 and L5 measures greater than 2 cm in thickness. At the thoracolumbar junction the appearance is that of confluent paraspinal inflammation with abnormal prevertebral and retroperitoneal lymph nodes individually up to 16 mm 17 mm short axis. Furthermore there is bulky left pelvic side wall lymphadenopathy/tumor, 33 mm in thickness (series 4, image 148). The partially visible prostate appears enlarged. Both partially visible kidneys do not appear obstructed. Calcified aortic atherosclerosis. Disc levels: Superimposed chronic lumbar disc, endplate, and posterior element degeneration. IMPRESSION: 1. Diffusely abnormal visible spine with pathologic fractures throughout the visible lower thoracic spine and involving L1 and L2. Lytic lesions in the L4 and L5 vertebral bodies. Mixed lytic and sclerotic lesions throughout the visible sacrum. 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 2. Bulky prevertebral, retroperitoneal, and left pelvic sidewall lymphadenopathy/tumor.  Partially visible prostate appears enlarged. The partially visible kidneys do not appear obstructed. 3. 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 The leading differential considerations are Lymphoma, Prostate cancer, and less likely Multiple Myeloma or other primary. Follow-up CT Chest Abdomen and Pelvis with IV and oral contrast probably is the best next step to evaluate further. Electronically Signed   By: Genevie Ann M.D.   On: 05/29/2016 12:25    Procedures Procedures (including critical care time)  Medications Ordered in ED Medications  0.9 %  sodium chloride infusion (not administered)  cefTRIAXone (ROCEPHIN) 1 g in dextrose 5 % 50 mL IVPB (not administered)  sodium chloride 0.9 % bolus 1,000 mL (1,000 mLs Intravenous New Bag/Given 05/29/16 1024)  sodium chloride 0.9 % bolus 1,000 mL (1,000 mLs Intravenous New Bag/Given 05/29/16 1023)     Initial Impression / Assessment and Plan / ED Course  I have reviewed the triage vital signs and the nursing notes.  Pertinent labs & imaging results that were available during my care of the patient were reviewed by me and considered in my medical decision making (see chart for details).  Clinical Course    Patient is a 55 year old male with diabetes, untreated, history of 40 pound weight loss in the last several months, back pain, dark urine. I'm concerned the patient could have diabetes that is not well controlled. In addition I'm concerned about some kind of neoplastic process. We will get urine, labs. Patient's mildly icteric so we'll get liver and lipase as well. Especially with patient's decreased appetite. We'll get CT of his L-spine to better characterize lesions. Ultimately patient will require follow-up with her primary care physician.   1:35 PM Patient had hemoglobin of 4. Repeated to make sure it was correct. It appears that this is correct. Also shows that there are  lytic lesions on the spine most likely from neoplastic process. We told patient the news will admit for further characterization and workup. We'll send HIV.  CRITICAL CARE Performed by: Gardiner Sleeper Total critical care time: 45 minutes Critical care time was exclusive of separately billable procedures and treating other patients. Critical care was necessary to treat or prevent imminent or life-threatening deterioration. Critical care was time spent personally by me on the following activities: development of treatment plan with patient and/or surrogate as well as nursing, discussions with consultants, evaluation of patient's response to treatment, examination of patient, obtaining history  from patient or surrogate, ordering and performing treatments and interventions, ordering and review of laboratory studies, ordering and review of radiographic studies, pulse oximetry and re-evaluation of patient's condition.   Final Clinical Impressions(s) / ED Diagnoses   Final diagnoses:  Anemia, unspecified type    New Prescriptions New Prescriptions   No medications on file     Jenette Rayson Julio Alm, MD 05/29/16 1335

## 2016-05-30 DIAGNOSIS — C801 Malignant (primary) neoplasm, unspecified: Secondary | ICD-10-CM

## 2016-05-30 DIAGNOSIS — C61 Malignant neoplasm of prostate: Secondary | ICD-10-CM | POA: Diagnosis present

## 2016-05-30 DIAGNOSIS — E119 Type 2 diabetes mellitus without complications: Secondary | ICD-10-CM

## 2016-05-30 DIAGNOSIS — M899 Disorder of bone, unspecified: Secondary | ICD-10-CM

## 2016-05-30 DIAGNOSIS — C779 Secondary and unspecified malignant neoplasm of lymph node, unspecified: Secondary | ICD-10-CM

## 2016-05-30 DIAGNOSIS — D649 Anemia, unspecified: Secondary | ICD-10-CM

## 2016-05-30 DIAGNOSIS — Z9889 Other specified postprocedural states: Secondary | ICD-10-CM

## 2016-05-30 DIAGNOSIS — C799 Secondary malignant neoplasm of unspecified site: Secondary | ICD-10-CM | POA: Diagnosis present

## 2016-05-30 LAB — GLUCOSE, CAPILLARY
GLUCOSE-CAPILLARY: 150 mg/dL — AB (ref 65–99)
GLUCOSE-CAPILLARY: 162 mg/dL — AB (ref 65–99)
Glucose-Capillary: 205 mg/dL — ABNORMAL HIGH (ref 65–99)
Glucose-Capillary: 197 mg/dL — ABNORMAL HIGH (ref 65–99)

## 2016-05-30 LAB — PROTEIN ELECTROPHORESIS, SERUM
A/G RATIO SPE: 0.7 (ref 0.7–1.7)
ALBUMIN ELP: 2.5 g/dL — AB (ref 2.9–4.4)
ALPHA-2-GLOBULIN: 1 g/dL (ref 0.4–1.0)
Alpha-1-Globulin: 0.4 g/dL (ref 0.0–0.4)
BETA GLOBULIN: 0.9 g/dL (ref 0.7–1.3)
Gamma Globulin: 1 g/dL (ref 0.4–1.8)
Globulin, Total: 3.4 g/dL (ref 2.2–3.9)
Total Protein ELP: 5.9 g/dL — ABNORMAL LOW (ref 6.0–8.5)

## 2016-05-30 LAB — URINE CULTURE: Culture: NO GROWTH

## 2016-05-30 LAB — COMPREHENSIVE METABOLIC PANEL
ALT: 11 U/L — ABNORMAL LOW (ref 17–63)
AST: 16 U/L (ref 15–41)
Albumin: 2.7 g/dL — ABNORMAL LOW (ref 3.5–5.0)
Alkaline Phosphatase: 1372 U/L — ABNORMAL HIGH (ref 38–126)
Anion gap: 9 (ref 5–15)
BILIRUBIN TOTAL: 1.9 mg/dL — AB (ref 0.3–1.2)
BUN: 10 mg/dL (ref 6–20)
CO2: 24 mmol/L (ref 22–32)
CREATININE: 0.95 mg/dL (ref 0.61–1.24)
Calcium: 8.7 mg/dL — ABNORMAL LOW (ref 8.9–10.3)
Chloride: 104 mmol/L (ref 101–111)
GFR calc Af Amer: >60 mL/min (ref >60)
Glucose, Bld: 181 mg/dL — ABNORMAL HIGH (ref 65–99)
POTASSIUM: 3.8 mmol/L (ref 3.5–5.1)
Sodium: 137 mmol/L (ref 135–145)
TOTAL PROTEIN: 6.8 g/dL (ref 6.5–8.1)

## 2016-05-30 LAB — CBC WITH DIFFERENTIAL/PLATELET
Band Neutrophils: 0 %
Basophils Absolute: 0.3 10*3/uL — ABNORMAL HIGH (ref 0.0–0.1)
Basophils Relative: 2 %
Blasts: 0 %
EOS PCT: 1 %
Eosinophils Absolute: 0.1 10*3/uL (ref 0.0–0.7)
HEMATOCRIT: 24.6 % — AB (ref 39.0–52.0)
Hemoglobin: 8.1 g/dL — ABNORMAL LOW (ref 13.0–17.0)
LYMPHS ABS: 4.2 10*3/uL — AB (ref 0.7–4.0)
LYMPHS PCT: 29 %
MCH: 28.6 pg (ref 26.0–34.0)
MCHC: 32.9 g/dL (ref 30.0–36.0)
MCV: 86.9 fL (ref 78.0–100.0)
MONOS PCT: 7 %
Metamyelocytes Relative: 0 %
Monocytes Absolute: 1 10*3/uL (ref 0.1–1.0)
Myelocytes: 0 %
NEUTROS ABS: 8.9 10*3/uL — AB (ref 1.7–7.7)
NEUTROS PCT: 61 %
NRBC: 21 /100{WBCs} — AB
OTHER: 0 %
Platelets: 130 10*3/uL — ABNORMAL LOW (ref 150–400)
Promyelocytes Absolute: 0 %
RBC: 2.83 MIL/uL — AB (ref 4.22–5.81)
RDW: 16.6 % — AB (ref 11.5–15.5)
WBC: 14.5 10*3/uL — AB (ref 4.0–10.5)

## 2016-05-30 LAB — KAPPA/LAMBDA LIGHT CHAINS
KAPPA, LAMDA LIGHT CHAIN RATIO: 1.69 — AB (ref 0.26–1.65)
Kappa free light chain: 63.6 mg/L — ABNORMAL HIGH (ref 3.3–19.4)
Lambda free light chains: 37.6 mg/L — ABNORMAL HIGH (ref 5.7–26.3)

## 2016-05-30 MED ORDER — INSULIN ASPART 100 UNIT/ML ~~LOC~~ SOLN
0.0000 [IU] | Freq: Every day | SUBCUTANEOUS | Status: DC
  Administered 2016-06-01: 2 [IU] via SUBCUTANEOUS

## 2016-05-30 MED ORDER — INSULIN ASPART 100 UNIT/ML ~~LOC~~ SOLN
0.0000 [IU] | Freq: Three times a day (TID) | SUBCUTANEOUS | Status: DC
  Administered 2016-05-30: 3 [IU] via SUBCUTANEOUS
  Administered 2016-05-30 – 2016-05-31 (×2): 2 [IU] via SUBCUTANEOUS
  Administered 2016-05-31: 1 [IU] via SUBCUTANEOUS
  Administered 2016-06-01: 3 [IU] via SUBCUTANEOUS
  Administered 2016-06-01: 1 [IU] via SUBCUTANEOUS
  Administered 2016-06-01: 3 [IU] via SUBCUTANEOUS
  Administered 2016-06-02 (×2): 2 [IU] via SUBCUTANEOUS

## 2016-05-30 NOTE — Care Management Note (Signed)
Case Management Note  Patient Details  Name: Cameron Marshall MRN: [id number] Date of Birth: [birth year]  Subjective/Objective:      Pt presents with worsening lower back pain. 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 Resides with housemate Danny. States independent with ADL's PTA and no DME usage.         PCP; Pt without PCP. CM to provided pt with [facility]  information .  Action/Plan:  Urology and Oncology consults  pending.... CM to f/u with disposition needs.  Expected Discharge Date:                  Expected Discharge Plan:  Home/Self Care  In-House Referral:     Discharge planning Services  CM Consult  Post Acute Care Choice:    Choice offered to:     DME Arranged:    DME Agency:     HH Arranged:    HH Agency:     Status of Service:  In process, will continue to follow  If discussed at Long Length of Stay Meetings, dates discussed:    Additional Comments:  Sharin Mons, RN 05/30/2016, 12:09 PM

## 2016-05-30 NOTE — Consult Note (Signed)
Urology Consult  Referring physician: Ezzie Dural Reason for referral: Metastatic Prostate cancer  Chief Complaint: Metastatic Prostate cancer  History of Present Illness: Patient admitted with back pain, abnormal labs, and found to have PSA of > 1400 and diffuse metastatic disease to spinal cord and bones and nodes; oncology consulted; Cr 1.23; severely anemic;  on rocephin; being observed for neuro signs; has pathologic fractures;  Back pain x 3 weeks; no leg pain or leg weakness Nocturia x 3 and frequency q 90 mins at baseline; slow flow; no GU surgery; no UTI; no stones  Modifying factors: There are no other modifying factors  Associated signs and symptoms: There are no other associated signs and symptoms Aggravating and relieving factors: There are no other aggravating or relieving factors Severity: Moderate Duration: Persistent  Past Medical History:  Diagnosis Date  . Anemia   . Heart murmur    "sometimes" (05/29/2016)  . History of blood transfusion 05/29/2016   anemia  . Type II diabetes mellitus ([place])    Past Surgical History:  Procedure Laterality Date  . NO PAST SURGERIES      Medications: I have reviewed the patient's current medications. Allergies: No Known Allergies  History reviewed. No pertinent family history. Social History:  reports that he has been smoking.  He has a 39.00 pack-year smoking history. He has never used smokeless tobacco. He reports that he drinks alcohol. He reports that he does not use drugs.  ROS: All systems are reviewed and negative except as noted. Rest negative  Physical Exam:  Vital signs in last 24 hours: Temp:  [97.5 F (36.4 C)-98.8 F (37.1 C)] 98.2 F (36.8 C) (10/11 0655) Pulse Rate:  [37-103] 91 (10/11 0655) Resp:  [16-20] 18 (10/11 0655) BP: (110-141)/(50-65) 120/63 (10/11 0655) SpO2:  [100 %] 100 % (10/11 0655) Weight:  [93 kg (205 lb)] 93 kg (205 lb) (10/10 1700)  Cardiovascular: Skin warm; not flushed Respiratory:  Breaths quiet; no shortness of breath Abdomen: No masses Neurological: Normal sensation to touch Musculoskeletal: Normal motor function arms and legs Lymphatics: No inguinal adenopathy Skin: No rashes Genitourinary:large firm prostate no nodules >70 gms  Laboratory Data:  Results for orders placed or performed during the hospital encounter of 05/29/16 (from the past 72 hour(s))  Comprehensive metabolic panel     Status: Abnormal   Collection Time: 05/29/16 10:02 AM  Result Value Ref Range   Sodium 135 135 - 145 mmol/L   Potassium 3.4 (L) 3.5 - 5.1 mmol/L   Chloride 98 (L) 101 - 111 mmol/L   CO2 25 22 - 32 mmol/L   Glucose, Bld 205 (H) 65 - 99 mg/dL   BUN 15 6 - 20 mg/dL   Creatinine, Ser 1.23 0.61 - 1.24 mg/dL   Calcium 8.7 (L) 8.9 - 10.3 mg/dL   Total Protein 6.9 6.5 - 8.1 g/dL   Albumin 2.8 (L) 3.5 - 5.0 g/dL   AST 15 15 - 41 U/L   ALT 13 (L) 17 - 63 U/L   Alkaline Phosphatase 1,156 (H) 38 - 126 U/L   Total Bilirubin 1.7 (H) 0.3 - 1.2 mg/dL   GFR calc non Af Amer >60 >60 mL/min   GFR calc Af Amer >60 >60 mL/min    Comment: (NOTE) The eGFR has been calculated using the CKD EPI equation. This calculation has not been validated in all clinical situations. eGFR's persistently <60 mL/min signify possible Chronic Kidney Disease.    Anion gap 12 5 - 15  CBC  with Differential/Platelet     Status: Abnormal   Collection Time: 05/29/16 10:02 AM  Result Value Ref Range   WBC 15.8 (H) 4.0 - 10.5 K/uL    Comment: ADJUSTED FOR NUCLEATED RBC'S   RBC 1.67 (L) 4.22 - 5.81 MIL/uL   Hemoglobin 4.6 (LL) 13.0 - 17.0 g/dL    Comment: REPEATED TO VERIFY CRITICAL RESULT CALLED TO, READ BACK BY AND VERIFIED WITH: N SIMMONS,RN [phone] WILDERK    HCT 15.3 (L) 39.0 - 52.0 %   MCV 91.6 78.0 - 100.0 fL   MCH 27.5 26.0 - 34.0 pg   MCHC 30.1 30.0 - 36.0 g/dL   RDW 16.5 (H) 11.5 - 15.5 %   Platelets 157 150 - 400 K/uL   Neutrophils Relative % 67 %   Lymphocytes Relative 27 %   Monocytes  Relative 5 %   Eosinophils Relative 0 %   Basophils Relative 1 %   Neutro Abs 10.5 (H) 1.7 - 7.7 K/uL   Lymphs Abs 4.3 (H) 0.7 - 4.0 K/uL   Monocytes Absolute 0.8 0.1 - 1.0 K/uL   Eosinophils Absolute 0.0 0.0 - 0.7 K/uL   Basophils Absolute 0.2 (H) 0.0 - 0.1 K/uL   RBC Morphology POLYCHROMASIA PRESENT    WBC Morphology MILD LEFT SHIFT (1-5% METAS, OCC MYELO, OCC BANDS)   Lipase, blood     Status: None   Collection Time: 05/29/16 10:02 AM  Result Value Ref Range   Lipase 21 11 - 51 U/L  I-Stat CG4 Lactic Acid, ED     Status: Abnormal   Collection Time: 05/29/16 10:20 AM  Result Value Ref Range   Lactic Acid, Venous 2.29 (HH) 0.5 - 1.9 mmol/L   Comment NOTIFIED PHYSICIAN   I-Stat venous blood gas, ED     Status: Abnormal   Collection Time: 05/29/16 10:20 AM  Result Value Ref Range   pH, Ven 7.464 (H) 7.250 - 7.430   pCO2, Ven 34.9 (L) 44.0 - 60.0 mmHg   pO2, Ven 79.0 (H) 32.0 - 45.0 mmHg   Bicarbonate 25.0 20.0 - 28.0 mmol/L   TCO2 26 0 - 100 mmol/L   O2 Saturation 96.0 %   Acid-Base Excess 1.0 0.0 - 2.0 mmol/L   Patient temperature HIDE    Sample type VENOUS   Urine culture     Status: None   Collection Time: 05/29/16 11:38 AM  Result Value Ref Range   Specimen Description URINE, RANDOM    Special Requests NONE    Culture NO GROWTH    Report Status 05/30/2016 FINAL   Urinalysis, Routine w reflex microscopic (not at [facility])     Status: Abnormal   Collection Time: 05/29/16 11:38 AM  Result Value Ref Range   Color, Urine ORANGE (A) YELLOW    Comment: BIOCHEMICALS MAY BE AFFECTED BY COLOR   APPearance HAZY (A) CLEAR   Specific Gravity, Urine 1.017 1.005 - 1.030   pH 5.5 5.0 - 8.0   Glucose, UA NEGATIVE NEGATIVE mg/dL   Hgb urine dipstick NEGATIVE NEGATIVE   Bilirubin Urine SMALL (A) NEGATIVE   Ketones, ur 15 (A) NEGATIVE mg/dL   Protein, ur 30 (A) NEGATIVE mg/dL   Nitrite POSITIVE (A) NEGATIVE   Leukocytes, UA NEGATIVE NEGATIVE  Urine microscopic-add on     Status:  Abnormal   Collection Time: 05/29/16 11:38 AM  Result Value Ref Range   Squamous Epithelial / LPF 0-5 (A) NONE SEEN   WBC, UA NONE SEEN 0 - 5 WBC/hpf  RBC / HPF 0-5 0 - 5 RBC/hpf   Bacteria, UA RARE (A) NONE SEEN   Casts HYALINE CASTS (A) NEGATIVE  CBC with Differential/Platelet     Status: Abnormal   Collection Time: 05/29/16 12:21 PM  Result Value Ref Range   WBC 15.2 (H) 4.0 - 10.5 K/uL   RBC 1.60 (L) 4.22 - 5.81 MIL/uL   Hemoglobin 4.4 (LL) 13.0 - 17.0 g/dL    Comment: REPEATED TO VERIFY CRITICAL VALUE NOTED.  VALUE IS CONSISTENT WITH PREVIOUSLY REPORTED AND CALLED VALUE.    HCT 14.7 (L) 39.0 - 52.0 %   MCV 91.9 78.0 - 100.0 fL   MCH 27.5 26.0 - 34.0 pg   MCHC 29.9 (L) 30.0 - 36.0 g/dL   RDW 16.7 (H) 11.5 - 15.5 %   Platelets 142 (L) 150 - 400 K/uL   Neutrophils Relative % 70 %   Lymphocytes Relative 26 %   Monocytes Relative 3 %   Eosinophils Relative 1 %   Basophils Relative 0 %   Band Neutrophils 0 %   Metamyelocytes Relative 0 %   Myelocytes 0 %   Promyelocytes Absolute 0 %   Blasts 0 %   nRBC 12 (H) 0 /100 WBC   Other 0 %   Neutro Abs 10.5 (H) 1.7 - 7.7 K/uL   Lymphs Abs 4.0 0.7 - 4.0 K/uL   Monocytes Absolute 0.5 0.1 - 1.0 K/uL   Eosinophils Absolute 0.2 0.0 - 0.7 K/uL   Basophils Absolute 0.0 0.0 - 0.1 K/uL   RBC Morphology POLYCHROMASIA PRESENT    WBC Morphology      MODERATE LEFT SHIFT (>5% METAS AND MYELOS,OCC PRO NOTED)  Type and screen [place]     Status: None (Preliminary result)   Collection Time: 05/29/16  3:13 PM  Result Value Ref Range   ABO/RH(D) B POS    Antibody Screen NEG    Sample Expiration 06/01/2016    Unit Number [id number]    Blood Component Type RED CELLS,LR    Unit division 00    Status of Unit REL FROM [facility]    Transfusion Status OK TO TRANSFUSE    Crossmatch Result Compatible    Unit Number [id number]    Blood Component Type RED CELLS,LR    Unit division 00    Status of Unit ISSUED,FINAL     Transfusion Status OK TO TRANSFUSE    Crossmatch Result Compatible    Unit Number [id number]    Blood Component Type RED CELLS,LR    Unit division 00    Status of Unit ISSUED    Transfusion Status OK TO TRANSFUSE    Crossmatch Result Compatible    Unit Number [id number]    Blood Component Type RED CELLS,LR    Unit division 00    Status of Unit ISSUED,FINAL    Transfusion Status OK TO TRANSFUSE    Crossmatch Result Compatible   Iron and TIBC     Status: Abnormal   Collection Time: 05/29/16  3:13 PM  Result Value Ref Range   Iron 74 45 - 182 ug/dL   TIBC 228 (L) 250 - 450 ug/dL   Saturation Ratios 32 17.9 - 39.5 %   UIBC 154 ug/dL  Prepare RBC     Status: None   Collection Time: 05/29/16  3:13 PM  Result Value Ref Range   Order Confirmation ORDER PROCESSED BY BLOOD BANK   Rapid HIV screen (HIV 1/2 Ab+Ag)     Status:  None   Collection Time: 05/29/16  3:13 PM  Result Value Ref Range   HIV-1 P24 Antigen - HIV24 NON REACTIVE NON REACTIVE   HIV 1/2 Antibodies NON REACTIVE NON REACTIVE   Interpretation (HIV Ag Ab)      A non reactive test result means that HIV 1 or HIV 2 antibodies and HIV 1 p24 antigen were not detected in the specimen.  Reticulocytes     Status: Abnormal   Collection Time: 05/29/16  3:13 PM  Result Value Ref Range   Retic Ct Pct 8.8 (H) 0.4 - 3.1 %   RBC. 1.55 (L) 4.22 - 5.81 MIL/uL   Retic Count, Manual 136.4 19.0 - 186.0 K/uL  ABO/Rh     Status: None   Collection Time: 05/29/16  3:13 PM  Result Value Ref Range   ABO/RH(D) B POS   PSA     Status: Abnormal   Collection Time: 05/29/16  3:22 PM  Result Value Ref Range   PSA 1,471.00 (H) 0.00 - 4.00 ng/mL    Comment: (NOTE) While PSA levels of <=4.0 ng/ml are reported as reference range, some men with levels below 4.0 ng/ml can have prostate cancer and many men with PSA above 4.0 ng/ml do not have prostate cancer.  Other tests such as free PSA, age specific reference ranges, PSA velocity and  PSA doubling time may be helpful especially in men less than 68 years old.   POC occult blood, ED     Status: None   Collection Time: 05/29/16  3:37 PM  Result Value Ref Range   Fecal Occult Bld NEGATIVE NEGATIVE  Prepare RBC     Status: None   Collection Time: 05/29/16  4:00 PM  Result Value Ref Range   Order Confirmation ORDER PROCESSED BY BLOOD BANK   Save smear     Status: None   Collection Time: 05/29/16  4:17 PM  Result Value Ref Range   Smear Review SMEAR STAINED AND AVAILABLE FOR REVIEW   Folate     Status: Abnormal   Collection Time: 05/29/16  4:17 PM  Result Value Ref Range   Folate 4.6 (L) >5.9 ng/mL  Vitamin B12     Status: None   Collection Time: 05/29/16  4:17 PM  Result Value Ref Range   Vitamin B-12 298 180 - 914 pg/mL    Comment: (NOTE) This assay is not validated for testing neonatal or myeloproliferative syndrome specimens for Vitamin B12 levels.   Lactate dehydrogenase     Status: Abnormal   Collection Time: 05/29/16  4:17 PM  Result Value Ref Range   LDH 231 (H) 98 - 192 U/L  Lactic acid, plasma     Status: None   Collection Time: 05/29/16  4:17 PM  Result Value Ref Range   Lactic Acid, Venous 1.5 0.5 - 1.9 mmol/L  Glucose, capillary     Status: Abnormal   Collection Time: 05/29/16  9:56 PM  Result Value Ref Range   Glucose-Capillary 176 (H) 65 - 99 mg/dL  Glucose, capillary     Status: Abnormal   Collection Time: 05/30/16  7:53 AM  Result Value Ref Range   Glucose-Capillary 150 (H) 65 - 99 mg/dL  CBC with Differential/Platelet     Status: Abnormal   Collection Time: 05/30/16 10:14 AM  Result Value Ref Range   WBC 14.5 (H) 4.0 - 10.5 K/uL    Comment: ADJUSTED FOR NUCLEATED RBC'S   RBC 2.83 (L) 4.22 - 5.81 MIL/uL  Hemoglobin 8.1 (L) 13.0 - 17.0 g/dL    Comment: DELTA CHECK NOTED POST TRANSFUSION SPECIMEN    HCT 24.6 (L) 39.0 - 52.0 %   MCV 86.9 78.0 - 100.0 fL   MCH 28.6 26.0 - 34.0 pg   MCHC 32.9 30.0 - 36.0 g/dL   RDW 16.6 (H) 11.5 -  15.5 %   Platelets 130 (L) 150 - 400 K/uL   Neutrophils Relative % 61 %   Lymphocytes Relative 29 %   Monocytes Relative 7 %   Eosinophils Relative 1 %   Basophils Relative 2 %   Band Neutrophils 0 %   Metamyelocytes Relative 0 %   Myelocytes 0 %   Promyelocytes Absolute 0 %   Blasts 0 %   nRBC 21 (H) 0 /100 WBC   Other 0 %   Neutro Abs 8.9 (H) 1.7 - 7.7 K/uL   Lymphs Abs 4.2 (H) 0.7 - 4.0 K/uL   Monocytes Absolute 1.0 0.1 - 1.0 K/uL   Eosinophils Absolute 0.1 0.0 - 0.7 K/uL   Basophils Absolute 0.3 (H) 0.0 - 0.1 K/uL   RBC Morphology POLYCHROMASIA PRESENT    WBC Morphology MILD LEFT SHIFT (1-5% METAS, OCC MYELO, OCC BANDS)   Comprehensive metabolic panel     Status: Abnormal   Collection Time: 05/30/16 10:14 AM  Result Value Ref Range   Sodium 137 135 - 145 mmol/L   Potassium 3.8 3.5 - 5.1 mmol/L   Chloride 104 101 - 111 mmol/L   CO2 24 22 - 32 mmol/L   Glucose, Bld 181 (H) 65 - 99 mg/dL   BUN 10 6 - 20 mg/dL   Creatinine, Ser 0.95 0.61 - 1.24 mg/dL   Calcium 8.7 (L) 8.9 - 10.3 mg/dL   Total Protein 6.8 6.5 - 8.1 g/dL   Albumin 2.7 (L) 3.5 - 5.0 g/dL   AST 16 15 - 41 U/L   ALT 11 (L) 17 - 63 U/L   Alkaline Phosphatase 1,372 (H) 38 - 126 U/L   Total Bilirubin 1.9 (H) 0.3 - 1.2 mg/dL   GFR calc non Af Amer >60 >60 mL/min   GFR calc Af Amer >60 >60 mL/min    Comment: (NOTE) The eGFR has been calculated using the CKD EPI equation. This calculation has not been validated in all clinical situations. eGFR's persistently <60 mL/min signify possible Chronic Kidney Disease.    Anion gap 9 5 - 15  Glucose, capillary     Status: Abnormal   Collection Time: 05/30/16 11:45 AM  Result Value Ref Range   Glucose-Capillary 205 (H) 65 - 99 mg/dL   Recent Results (from the past 240 hour(s))  Urine culture     Status: None   Collection Time: 05/29/16 11:38 AM  Result Value Ref Range Status   Specimen Description URINE, RANDOM  Final   Special Requests NONE  Final   Culture NO  GROWTH  Final   Report Status 05/30/2016 FINAL  Final   Creatinine:  Recent Labs  05/29/16 1002 05/30/16 1014  CREATININE 1.23 0.95    Xrays: See report/chart Large irregular prostate; nodular; significant positive nodes; bone mets; lystic lesions and compression fractures L1 and L2 and 4/3; also thoracic disease: prostate cancer vs. Lymphoma ddx;   Impression/Assessment:  THe patient likely has metastatic prostate cancer; I spoke with Dr. Alinda Money our GU oncologist; he recommends that ONCOLOGY will likely start androgen deprivation therapy (firmagon) and abiraterone (zytiga) acutely   Plan:  Please see above and  have oncology see; we will follow; we will see as an outpt and biopsy prostate unless patient has a metastasis biopsied in interm  Achaia Garlock A 05/30/2016, 12:22 PM

## 2016-05-30 NOTE — Progress Notes (Signed)
   Subjective: Patient was feeling better this morning, states that his pain does not bother him while he is lying down, get worse with movement. He does not required any pain medicine overnight. We did told him regarding his diagnosis of metastatic prostate cancer. He seems to take this news very well. We told him that we will consult urology and oncology and proceed according to their instructions.  Objective:  Vital signs in last 24 hours: Vitals:   05/30/16 0233 05/30/16 0257 05/30/16 0322 05/30/16 0655  BP: 132/65 (!) 116/52 (!) 110/54 120/63  Pulse: 98 99 94 91  Resp: 16 18 18 18   Temp: 97.5 F (36.4 C) 97.8 F (36.6 C) 98.4 F (36.9 C) 98.2 F (36.8 C)  TempSrc: Oral Oral Oral Oral  SpO2: 100% 100% 100% 100%  Weight:      Height:       Gen. well-developed, well-nourished, in no acute distress. Eyes. Conjunctival pallor and mild scleral icterus. Lungs. Clear bilaterally CV. Regular rate and rhythm Abdomen. Soft, nontender, bowel sounds positive. Extremities. No edema, no cyanosis, pulses 2+ bilaterally  Labs. CBC Latest Ref Rng & Units 05/30/2016 05/29/2016 05/29/2016  WBC 4.0 - 10.5 K/uL 14.5(H) 15.2(H) 15.8(H)  Hemoglobin 13.0 - 17.0 g/dL 8.1(L) 4.4(LL) 4.6(LL)  Hematocrit 39.0 - 52.0 % 24.6(L) 14.7(L) 15.3(L)  Platelets 150 - 400 K/uL 130(L) 142(L) 157   PSA.  1,471.00  CT abdomen and pelvis and chest. IMPRESSION: 1. Diffusely enlarged and irregular appearing prostate, measuring up to 6.4 cm in AP dimension, with nodularity and extension into the base of the bladder. Findings compatible with metastatic prostate cancer. 2. Enlarged left pelvic sidewall nodes measure up to 3.1 cm in short axis, reflecting metastatic disease. 3. Enlarged retroperitoneal nodes, extending about the aorta and inferior vena cava, and inferiorly from the aortic bifurcation, measuring up to 2.5 cm in short axis. 4. Enlarged nodes noted adjacent to the proximal abdominal aorta under  the diaphragmatic crura. 5. Diffuse metastatic disease throughout the visualized osseous structures, with a mixed lytic and sclerotic appearance. Associated slight compression deformity at the superior endplates of 624THL, L1 and L2. 6. Mild bibasilar atelectasis noted.  Lungs otherwise clear. 7. Mild aortic atherosclerosis noted.  Assessment/Plan:  MR. Orloff 55 y.o man, without any significant past medical history, presented to the history of worsening lower back pain.  Metastatic prostate cancer. 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 There was a Questionableable epidural involvement. -Urology consult----They will see the patient in the hospital today. -Oncology consult----they will most probably arrange an outpatient appointment. -MRI of his lumbar spine, to check any spinal involvement. -Neurovascular check every 4 hourly.  Anemia. He was found to have hemoglobin of 4.4. He denies any symptoms. He was given 3 units of packed RBCs. On repeat check his hemoglobin was 8.1.  Newly diagnosed diabetes. His recent A1c was 6.7 and his CBG remains between 150-176. -We added sensitive sliding scale for him.  UTI. He do complained of some dark-colored urine, but denies any dysuria or burning. He has some urgency and hesitancy and difficulty with initiation. His urine analysis was positive for nitrates. -Culture and sensitivity -Rocephin 1 g daily -We will modify according to his sensitivity report.  Hypokalemia. Resolved after replacement.  Code Status. Full DVT. SCD Diet. Low-carb.  Dispo: Anticipated discharge in approximately 1-2 day(s).   Lorella Nimrod, MD 05/30/2016, 10:49 AM Pager: [phone]

## 2016-05-30 NOTE — Consult Note (Signed)
[place]  [phone]   HEMATOLOGY ONCOLOGY INPATIENT CONSULTATION   WHITTEN ANDREONI  DOB: 23-Jan-1961  MR#: [id number]  CSN#: [id number]    Requesting Physician: [facility]  Patient Care Team: No Pcp Per Patient as PCP - General (General Practice)  Reason for consult: probab metastatic prostate cancer   History of present illness:   55 year old African-American male, with past medical history of hypertension, who has not seen a doctor for a number of years, presented with back pain for 3 weeks. I was consulted for abnormal CT and upper findings, which is highly concerning for metastatic prostate cancer.  He has had mild urinary difficulty, frequent nocturia for a few years, but has not seek medical attention. He thought he pulled his back muscle after lifting heavy stuff from his stroke about 3 weeks ago. The pain has been persistent since then, especially when he bends, he has no significant pain when he lay flat. Due to the worsening pain, he went to urgent care, was given pain medication. He presented [facility] yesterday due to his worsening back pain. He denies any leg weakness, no urinary or stool incontinence, no numbness or tingling on his fingers or toes. His appetite has been decreased lately, and lost about 15 pounds. No other pain, dyspnea or abdominal discomfort.   MEDICAL HISTORY:  Past Medical History:  Diagnosis Date  . Anemia   . Heart murmur    "sometimes" (05/29/2016)  . History of blood transfusion 05/29/2016   anemia  . Type II diabetes mellitus ([place])     SURGICAL HISTORY: Past Surgical History:  Procedure Laterality Date  . NO PAST SURGERIES      SOCIAL HISTORY: Social History   Social History  . Marital status: Single    Spouse name: N/A  . Number of children: N/A  . Years of education: N/A   Occupational History  . Not on file.   Social History Main Topics  . Smoking status: Current Every Day Smoker   Packs/day: 1.00    Years: 39.00  . Smokeless tobacco: Never Used  . Alcohol use Yes     Comment: 05/29/2016 "none since age 70"  . Drug use: No  . Sexual activity: Not Currently   Other Topics Concern  . Not on file   Social History Narrative  . No narrative on file    FAMILY HISTORY: History reviewed. No pertinent family history.  ALLERGIES:  has No Known Allergies.  MEDICATIONS:  Current Facility-Administered Medications  Medication Dose Route Frequency Provider Last Rate Last Dose  . 0.9 %  sodium chloride infusion  10 mL/hr Intravenous Once Courteney Lyn Mackuen, MD      . acetaminophen (TYLENOL) tablet 650 mg  650 mg Oral Q6H PRN Jule Ser, DO       Or  . acetaminophen (TYLENOL) suppository 650 mg  650 mg Rectal Q6H PRN Jule Ser, DO      . cefTRIAXone (ROCEPHIN) 1 g in dextrose 5 % 50 mL IVPB  1 g Intravenous Q24H Jule Ser, DO   1 g at 05/30/16 1436  . feeding supplement (ENSURE ENLIVE) (ENSURE ENLIVE) liquid 237 mL  237 mL Oral BID BM Sid Falcon, MD   237 mL at 05/30/16 1438  . HYDROcodone-acetaminophen (NORCO/VICODIN) 5-325 MG per tablet 1-2 tablet  1-2 tablet Oral Q4H PRN Jule Ser, DO      . insulin aspart (novoLOG) injection 0-5 Units  0-5 Units Subcutaneous QHS Sumayya  Amin, MD      . insulin aspart (novoLOG) injection 0-9 Units  0-9 Units Subcutaneous TID WC Lorella Nimrod, MD   2 Units at 05/30/16 1701  . sodium chloride flush (NS) 0.9 % injection 3 mL  3 mL Intravenous Q12H Jule Ser, DO   3 mL at 05/30/16 2202    REVIEW OF SYSTEMS:   Constitutional: Denies fevers, chills or abnormal night sweats Eyes: Denies blurriness of vision, double vision or watery eyes Ears, nose, mouth, throat, and face: Denies mucositis or sore throat Respiratory: Denies cough, dyspnea or wheezes Cardiovascular: Denies palpitation, chest discomfort or lower extremity swelling Gastrointestinal:  Denies nausea, heartburn or change in bowel habits Skin: Denies  abnormal skin rashes Lymphatics: Denies new lymphadenopathy or easy bruising Neurological:Denies numbness, tingling or new weaknesses Behavioral/Psych: Mood is stable, no new changes  All other systems were reviewed with the patient and are negative.  PHYSICAL EXAMINATION: ECOG PERFORMANCE STATUS: 2 - Symptomatic, <50% confined to bed  Vitals:   05/30/16 0655 05/30/16 1310  BP: 120/63 128/62  Pulse: 91 99  Resp: 18 18  Temp: 98.2 F (36.8 C) 98 F (36.7 C)   Filed Weights   05/29/16 1700  Weight: 205 lb (93 kg)    GENERAL:alert, no distress and comfortable SKIN: skin color, texture, turgor are normal, no rashes or significant lesions EYES: normal, conjunctiva are pink and non-injected, sclera clear OROPHARYNX:no exudate, no erythema and lips, buccal mucosa, and tongue normal  NECK: supple, thyroid normal size, non-tender, without nodularity LYMPH:  no palpable lymphadenopathy in the cervical, axillary or inguinal LUNGS: clear to auscultation and percussion with normal breathing effort HEART: regular rate & rhythm and no murmurs and no lower extremity edema ABDOMEN:abdomen soft, non-tender and normal bowel sounds Musculoskeletal:no cyanosis of digits and no clubbing  PSYCH: alert & oriented x 3 with fluent speech NEURO: no focal motor/sensory deficits  LABORATORY DATA:  I have reviewed the data as listed Recent Labs  05/15/16 1521 05/29/16 1002 05/30/16 1014  NA 138 135 137  K 3.6 3.4* 3.8  CL 101 98* 104  CO2 '26 25 24  ' GLUCOSE 179* 205* 181*  BUN '19 15 10  ' CREATININE 1.23 1.23 0.95  CALCIUM 10.4* 8.7* 8.7*  GFRNONAA >60 >60 >60  GFRAA >60 >60 >60  PROT  --  6.9 6.8  ALBUMIN  --  2.8* 2.7*  AST  --  15 16  ALT  --  13* 11*  ALKPHOS  --  1,156* 1,372*  BILITOT  --  1.7* 1.9*   CBC Latest Ref Rng & Units 05/30/2016 05/29/2016 05/29/2016  WBC 4.0 - 10.5 K/uL 14.5(H) 15.2(H) 15.8(H)  Hemoglobin 13.0 - 17.0 g/dL 8.1(L) 4.4(LL) 4.6(LL)  Hematocrit 39.0 - 52.0 %  24.6(L) 14.7(L) 15.3(L)  Platelets 150 - 400 K/uL 130(L) 142(L) 157   PSA  Order: [id number]  Status:  Final result Visible to patient:  No (Not Released) Next appt:  None   Ref Range & Units 1d ago  PSA 0.00 - 4.00 ng/mL 1,471.00          RADIOGRAPHIC STUDIES: I have personally reviewed the radiological images as listed and agreed with the findings in the report. Dg Lumbar Spine Complete  Result Date: 05/15/2016 CLINICAL DATA:  Patient with lower back pain after twisting injury 1 month prior. Initial encounter. EXAM: LUMBAR SPINE - COMPLETE 4+ VIEW COMPARISON:  None. FINDINGS: Multilevel degenerative disc disease with bulky anterior endplate osteophytosis. Multilevel facet degenerative changes. Age-indeterminate compression  deformities of the superior endplate of the L1 and V40 vertebral bodies. SI joints are unremarkable. Stool throughout the colon. IMPRESSION: Multilevel degenerative disc and facet disease. Age-indeterminate superior endplate compression deformities of the T12 and L1 vertebral bodies. Recommend correlation for point tenderness. These results will be called to the ordering clinician or representative by the Radiologist Assistant, and communication documented in the PACS or zVision Dashboard. Electronically Signed   By: Lovey Newcomer M.D.   On: 05/15/2016 15:54   Ct Chest W Contrast  Result Date: 05/29/2016 CLINICAL DATA:  Diffuse metastatic bony disease noted on lumbar spine CT. Further evaluation requested, to determine source of malignancy. Initial encounter. EXAM: CT CHEST, ABDOMEN, AND PELVIS WITH CONTRAST TECHNIQUE: Multidetector CT imaging of the chest, abdomen and pelvis was performed following the standard protocol during bolus administration of intravenous contrast. CONTRAST:  1108m ISOVUE-300 IOPAMIDOL (ISOVUE-300) INJECTION 61% COMPARISON:  CT of the lumbar spine performed earlier today at 12:03 p.m. FINDINGS: CT CHEST FINDINGS Cardiovascular: Scattered  calcification is noted along the aortic arch and descending thoracic aorta. The heart is unremarkable in appearance. The great vessels are within normal limits. Mediastinum/Nodes: The mediastinum is unremarkable in appearance. No mediastinal lymphadenopathy is seen. No pericardial effusion is seen. The visualized portions of the thyroid gland are unremarkable in appearance. No axillary lymphadenopathy is seen. Lungs/Pleura: Mild bibasilar atelectasis is noted. The lungs are otherwise grossly clear. No pleural effusion or pneumothorax is seen. No masses are identified. Musculoskeletal: No acute osseous abnormalities are identified. The visualized musculature is unremarkable in appearance. CT ABDOMEN PELVIS FINDINGS Hepatobiliary: The liver is unremarkable in appearance. The gallbladder is unremarkable in appearance. The common bile duct remains normal in caliber. Pancreas: The pancreas is within normal limits. Spleen: The spleen is unremarkable in appearance. Adrenals/Urinary Tract: The adrenal glands are unremarkable in appearance. The kidneys are within normal limits. There is no evidence of hydronephrosis. No renal or ureteral stones are identified. No perinephric stranding is seen. Stomach/Bowel: The stomach is unremarkable in appearance. The small bowel is within normal limits. The appendix is normal in caliber, without evidence of appendicitis. The colon is unremarkable in appearance. Vascular/Lymphatic: Mild scattered calcification is noted along the distal abdominal aorta and its branches. Scattered enlarged nodes are noted adjacent to the proximal abdominal aorta under the diaphragmatic crura. Enlarged retroperitoneal nodes are seen, surrounding the aorta and inferior vena cava, with some degree of mass effect on the inferior vena cava. These nodes measure up to 2.4 cm in short axis. Enlarged nodes also extend inferiorly from the aortic bifurcation, measuring up to 2.5 cm in short axis anterior to the lower  lumbar spine. There are also enlarged left-sided pelvic sidewall nodes, measuring up to 3.1 cm in short axis. Reproductive: The prostate is enlarged, measuring up to 6.4 cm in AP dimension, with diffuse nodularity and extension into the base of the bladder. Findings are compatible with metastatic prostate cancer. The bladder is mildly distended and grossly unremarkable in appearance. Other: No additional soft tissue abnormalities are seen. Musculoskeletal: Diffuse metastatic disease is noted throughout the visualized osseous structures, with a mixed lytic and sclerotic appearance. There is associated slight compression deformity at the superior endplates of TJ81 L1 and L2. IMPRESSION: 1. Diffusely enlarged and irregular appearing prostate, measuring up to 6.4 cm in AP dimension, with nodularity and extension into the base of the bladder. Findings compatible with metastatic prostate cancer. 2. Enlarged left pelvic sidewall nodes measure up to 3.1 cm in short axis, reflecting metastatic  disease. 3. Enlarged retroperitoneal nodes, extending about the aorta and inferior vena cava, and inferiorly from the aortic bifurcation, measuring up to 2.5 cm in short axis. 4. Enlarged nodes noted adjacent to the proximal abdominal aorta under the diaphragmatic crura. 5. Diffuse metastatic disease throughout the visualized osseous structures, with a mixed lytic and sclerotic appearance. Associated slight compression deformity at the superior endplates of I71, L1 and L2. 6. Mild bibasilar atelectasis noted.  Lungs otherwise clear. 7. Mild aortic atherosclerosis noted. Electronically Signed   By: Garald Balding M.D.   On: 05/29/2016 21:51   Ct Lumbar Spine Wo Contrast  Result Date: 05/29/2016 CLINICAL DATA:  55 year old male with back pain, unintentional weight loss nausea, dark urine. Initial encounter. Age indeterminate compression fractures on September lumbar radiographs. EXAM: CT LUMBAR SPINE WITHOUT CONTRAST TECHNIQUE:  Multidetector CT imaging of the lumbar spine was performed without intravenous contrast administration. Multiplanar CT image reconstructions were also generated. COMPARISON:  Lumbar radiographs 05/15/2016. FINDINGS: Segmentation: Normal. Alignment: Overall stable vertebral height alignment since September. Straightening of lumbar lordosis. Vertebrae: Severely abnormal bone mineralization throughout the visible spine, consisting of a permeative pattern of destruction in the lower thoracic and upper lumbar levels. Lytic lesion throughout the central L4 vertebral body. Lytic lesion throughout the inferior L5 vertebral body,. Mixed lytic and miliary sclerotic lesions in the visible sacrum and medial iliac bones. Pathologic appearing vertebral body superior endplate fractures of I45, T12, L1, and L2. There also appears to be a pathologic fracture of the left T11 pars and lamina as seen on series 7, image 38. Mild loss of vertebral body height throughout the lower thoracic spine to L2. There could be mild epidural tumor ventrally throughout much of the lower thoracic and lumbar spine. Paraspinal and other soft tissues: Bulky abnormal prevertebral and paraspinal soft tissue appearing contiguous from the lower thoracic spine through the lumbar spine, and maximal in the mid to lower lumbar spine where confluent soft tissue tumor anterior to L4 and L5 measures greater than 2 cm in thickness. At the thoracolumbar junction the appearance is that of confluent paraspinal inflammation with abnormal prevertebral and retroperitoneal lymph nodes individually up to 16 mm 17 mm short axis. Furthermore there is bulky left pelvic side wall lymphadenopathy/tumor, 33 mm in thickness (series 4, image 148). The partially visible prostate appears enlarged. Both partially visible kidneys do not appear obstructed. Calcified aortic atherosclerosis. Disc levels: Superimposed chronic lumbar disc, endplate, and posterior element degeneration.  IMPRESSION: 1. Diffusely abnormal visible spine with pathologic fractures throughout the visible lower thoracic spine and involving L1 and L2. Lytic lesions in the L4 and L5 vertebral bodies. Mixed lytic and sclerotic lesions throughout the visible sacrum. 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 2. Bulky prevertebral, retroperitoneal, and left pelvic sidewall lymphadenopathy/tumor. Partially visible prostate appears enlarged. The partially visible kidneys do not appear obstructed. 3. 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 The leading differential considerations are Lymphoma, Prostate cancer, and less likely Multiple Myeloma or other primary. Follow-up CT Chest Abdomen and Pelvis with IV and oral contrast probably is the best next step to evaluate further. Electronically Signed   By: Genevie Ann M.D.   On: 05/29/2016 12:25   Ct Abdomen Pelvis W Contrast  Result Date: 05/29/2016 CLINICAL DATA:  Diffuse metastatic bony disease noted on lumbar spine CT. Further evaluation requested, to determine source of malignancy. Initial encounter. EXAM: CT CHEST, ABDOMEN, AND PELVIS WITH CONTRAST TECHNIQUE: Multidetector CT imaging  of the chest, abdomen and pelvis was performed following the standard protocol during bolus administration of intravenous contrast. CONTRAST:  136m ISOVUE-300 IOPAMIDOL (ISOVUE-300) INJECTION 61% COMPARISON:  CT of the lumbar spine performed earlier today at 12:03 p.m. FINDINGS: CT CHEST FINDINGS Cardiovascular: Scattered calcification is noted along the aortic arch and descending thoracic aorta. The heart is unremarkable in appearance. The great vessels are within normal limits. Mediastinum/Nodes: The mediastinum is unremarkable in appearance. No mediastinal lymphadenopathy is seen. No pericardial effusion is seen. The visualized portions of the thyroid gland are unremarkable in appearance. No axillary lymphadenopathy is  seen. Lungs/Pleura: Mild bibasilar atelectasis is noted. The lungs are otherwise grossly clear. No pleural effusion or pneumothorax is seen. No masses are identified. Musculoskeletal: No acute osseous abnormalities are identified. The visualized musculature is unremarkable in appearance. CT ABDOMEN PELVIS FINDINGS Hepatobiliary: The liver is unremarkable in appearance. The gallbladder is unremarkable in appearance. The common bile duct remains normal in caliber. Pancreas: The pancreas is within normal limits. Spleen: The spleen is unremarkable in appearance. Adrenals/Urinary Tract: The adrenal glands are unremarkable in appearance. The kidneys are within normal limits. There is no evidence of hydronephrosis. No renal or ureteral stones are identified. No perinephric stranding is seen. Stomach/Bowel: The stomach is unremarkable in appearance. The small bowel is within normal limits. The appendix is normal in caliber, without evidence of appendicitis. The colon is unremarkable in appearance. Vascular/Lymphatic: Mild scattered calcification is noted along the distal abdominal aorta and its branches. Scattered enlarged nodes are noted adjacent to the proximal abdominal aorta under the diaphragmatic crura. Enlarged retroperitoneal nodes are seen, surrounding the aorta and inferior vena cava, with some degree of mass effect on the inferior vena cava. These nodes measure up to 2.4 cm in short axis. Enlarged nodes also extend inferiorly from the aortic bifurcation, measuring up to 2.5 cm in short axis anterior to the lower lumbar spine. There are also enlarged left-sided pelvic sidewall nodes, measuring up to 3.1 cm in short axis. Reproductive: The prostate is enlarged, measuring up to 6.4 cm in AP dimension, with diffuse nodularity and extension into the base of the bladder. Findings are compatible with metastatic prostate cancer. The bladder is mildly distended and grossly unremarkable in appearance. Other: No additional  soft tissue abnormalities are seen. Musculoskeletal: Diffuse metastatic disease is noted throughout the visualized osseous structures, with a mixed lytic and sclerotic appearance. There is associated slight compression deformity at the superior endplates of TY40 L1 and L2. IMPRESSION: 1. Diffusely enlarged and irregular appearing prostate, measuring up to 6.4 cm in AP dimension, with nodularity and extension into the base of the bladder. Findings compatible with metastatic prostate cancer. 2. Enlarged left pelvic sidewall nodes measure up to 3.1 cm in short axis, reflecting metastatic disease. 3. Enlarged retroperitoneal nodes, extending about the aorta and inferior vena cava, and inferiorly from the aortic bifurcation, measuring up to 2.5 cm in short axis. 4. Enlarged nodes noted adjacent to the proximal abdominal aorta under the diaphragmatic crura. 5. Diffuse metastatic disease throughout the visualized osseous structures, with a mixed lytic and sclerotic appearance. Associated slight compression deformity at the superior endplates of TH47 L1 and L2. 6. Mild bibasilar atelectasis noted.  Lungs otherwise clear. 7. Mild aortic atherosclerosis noted. Electronically Signed   By: JGarald BaldingM.D.   On: 05/29/2016 21:51    ASSESSMENT & PLAN:  55year old African-American male, with past medical history of hypertension, who has not seen a doctor for a number of years, presented  with back pain for 3 weeks.  1. Diffuse bone and lymph nodes metastases, likely metastatic prostate cancer 2. Severe anemia, likely related to his diffuse bone metastasis 3. Multiple pathological fracture of T11-L2 4. HTN 5. Hyperglycemia  6. Leukocytosis, secondary infection (UTI) vs reactive to underlying malignancy 7. Mild bilirubinemia   Recommendations: -his images and lab test are most consistent with metastatic prostate cancer -I recommend a tissue biopsy to confirm his diagnosis. Please consult IR for left pelvic  sidewall lymph nodes or bone biopsy -Patient may benefit from kyphoplasty, please consult IR -please obtain thoracic and lumbar MRI  -I briefly discussed treatment options for metastatic prostate cancer, including androgen deprivation, chemotherapy, and immunotherapy. -I will see him back after his biopsy, plan to start treatment before discharge   All questions were answered. The patient knows to call the clinic with any problems, questions or concerns.     Truitt Merle, MD 05/30/2016 10:59 PM

## 2016-05-30 NOTE — Telephone Encounter (Signed)
LM on pt's VM Need to see how pt is doing and to give lab results from recent visit on 9/26 Also let pt know labs can be obtained from [place]

## 2016-05-31 ENCOUNTER — Inpatient Hospital Stay (HOSPITAL_COMMUNITY): Payer: BC Managed Care – PPO

## 2016-05-31 LAB — GLUCOSE, CAPILLARY
GLUCOSE-CAPILLARY: 129 mg/dL — AB (ref 65–99)
GLUCOSE-CAPILLARY: 166 mg/dL — AB (ref 65–99)
Glucose-Capillary: 151 mg/dL — ABNORMAL HIGH (ref 65–99)

## 2016-05-31 LAB — COMPREHENSIVE METABOLIC PANEL
ALBUMIN: 2.7 g/dL — AB (ref 3.5–5.0)
ALT: 12 U/L — AB (ref 17–63)
AST: 14 U/L — AB (ref 15–41)
Alkaline Phosphatase: 1618 U/L — ABNORMAL HIGH (ref 38–126)
Anion gap: 9 (ref 5–15)
BILIRUBIN TOTAL: 1.4 mg/dL — AB (ref 0.3–1.2)
BUN: 7 mg/dL (ref 6–20)
CO2: 25 mmol/L (ref 22–32)
CREATININE: 0.82 mg/dL (ref 0.61–1.24)
Calcium: 8.7 mg/dL — ABNORMAL LOW (ref 8.9–10.3)
Chloride: 102 mmol/L (ref 101–111)
GFR calc Af Amer: >60 mL/min (ref >60)
GLUCOSE: 152 mg/dL — AB (ref 65–99)
POTASSIUM: 3.7 mmol/L (ref 3.5–5.1)
Sodium: 136 mmol/L (ref 135–145)
TOTAL PROTEIN: 6.2 g/dL — AB (ref 6.5–8.1)

## 2016-05-31 LAB — TYPE AND SCREEN
ABO/RH(D): B POS
ANTIBODY SCREEN: NEGATIVE
UNIT DIVISION: 0
Unit division: 0
Unit division: 0
Unit division: 0

## 2016-05-31 LAB — CBC WITH DIFFERENTIAL/PLATELET
BASOS PCT: 1 %
Basophils Absolute: 0.1 10*3/uL (ref 0.0–0.1)
EOS PCT: 1 %
Eosinophils Absolute: 0.1 10*3/uL (ref 0.0–0.7)
HEMATOCRIT: 23.4 % — AB (ref 39.0–52.0)
HEMOGLOBIN: 7.6 g/dL — AB (ref 13.0–17.0)
LYMPHS PCT: 28 %
Lymphs Abs: 4 10*3/uL (ref 0.7–4.0)
MCH: 28.4 pg (ref 26.0–34.0)
MCHC: 32.5 g/dL (ref 30.0–36.0)
MCV: 87.3 fL (ref 78.0–100.0)
MONO ABS: 1 10*3/uL (ref 0.1–1.0)
MONOS PCT: 7 %
NEUTROS PCT: 63 %
Neutro Abs: 9 10*3/uL — ABNORMAL HIGH (ref 1.7–7.7)
Platelets: 130 10*3/uL — ABNORMAL LOW (ref 150–400)
RBC: 2.68 MIL/uL — AB (ref 4.22–5.81)
RDW: 17.1 % — AB (ref 11.5–15.5)
WBC: 14.2 10*3/uL — AB (ref 4.0–10.5)

## 2016-05-31 LAB — CBC
HCT: 25.2 % — ABNORMAL LOW (ref 39.0–52.0)
HEMOGLOBIN: 8.1 g/dL — AB (ref 13.0–17.0)
MCH: 28.4 pg (ref 26.0–34.0)
MCHC: 32.1 g/dL (ref 30.0–36.0)
MCV: 88.4 fL (ref 78.0–100.0)
PLATELETS: 123 10*3/uL — AB (ref 150–400)
RBC: 2.85 MIL/uL — AB (ref 4.22–5.81)
RDW: 17.4 % — ABNORMAL HIGH (ref 11.5–15.5)
WBC: 14.4 10*3/uL — ABNORMAL HIGH (ref 4.0–10.5)

## 2016-05-31 LAB — PROTIME-INR
INR: 1.28
Prothrombin Time: 16 seconds — ABNORMAL HIGH (ref 11.4–15.2)

## 2016-05-31 LAB — APTT: APTT: 28 s (ref 24–36)

## 2016-05-31 MED ORDER — LORAZEPAM 2 MG/ML IJ SOLN
INTRAMUSCULAR | Status: AC
  Filled 2016-05-31: qty 1

## 2016-05-31 MED ORDER — MIDAZOLAM HCL 2 MG/2ML IJ SOLN
INTRAMUSCULAR | Status: AC
  Filled 2016-05-31: qty 4

## 2016-05-31 MED ORDER — MORPHINE SULFATE (PF) 2 MG/ML IV SOLN: 2.0000 mg | Freq: Once | INTRAVENOUS | Status: DC

## 2016-05-31 MED ORDER — LORAZEPAM 2 MG/ML IJ SOLN
1.0000 mg | Freq: Once | INTRAMUSCULAR | Status: AC | PRN
  Administered 2016-05-31: 1 mg via INTRAVENOUS

## 2016-05-31 MED ORDER — MIDAZOLAM HCL 2 MG/2ML IJ SOLN
INTRAMUSCULAR | Status: AC | PRN
  Administered 2016-05-31 (×2): 1 mg via INTRAVENOUS

## 2016-05-31 MED ORDER — SODIUM CHLORIDE 0.9 % IV SOLN
INTRAVENOUS | Status: AC
  Administered 2016-05-31: 11:00:00 via INTRAVENOUS

## 2016-05-31 MED ORDER — FAMOTIDINE 20 MG PO TABS
10.0000 mg | ORAL_TABLET | Freq: Once | ORAL | Status: AC
  Administered 2016-05-31: 10 mg via ORAL
  Filled 2016-05-31: qty 1

## 2016-05-31 MED ORDER — LORAZEPAM 0.5 MG PO TABS: 0.5000 mg | ORAL_TABLET | Freq: Once | ORAL | Status: DC

## 2016-05-31 MED ORDER — FENTANYL CITRATE (PF) 100 MCG/2ML IJ SOLN
INTRAMUSCULAR | Status: AC | PRN
  Administered 2016-05-31 (×2): 50 ug via INTRAVENOUS

## 2016-05-31 MED ORDER — LIDOCAINE HCL 1 % IJ SOLN
INTRAMUSCULAR | Status: AC
  Filled 2016-05-31: qty 20

## 2016-05-31 MED ORDER — FENTANYL CITRATE (PF) 100 MCG/2ML IJ SOLN
INTRAMUSCULAR | Status: AC
  Filled 2016-05-31: qty 4

## 2016-05-31 NOTE — Sedation Documentation (Signed)
Patient is resting comfortably. 

## 2016-05-31 NOTE — Progress Notes (Signed)
Initial Nutrition Assessment  DOCUMENTATION CODES:   Not applicable  INTERVENTION:   -Once diet is advanced, continue: Ensure Enlive po BID, each supplement provides 350 kcal and 20 grams of protein  NUTRITION DIAGNOSIS:   Increased nutrient needs related to chronic illness as evidenced by estimated needs.  GOAL:   Patient will meet greater than or equal to 90% of their needs  MONITOR:   PO intake, Supplement acceptance, Labs, Diet advancement, Weight trends, Skin, I & O's  REASON FOR ASSESSMENT:   Malnutrition Screening Tool    ASSESSMENT:   Cameron Marshall 55 y.o man, without any significant past medical history, presented to the history of worsening lower back pain.  Pt admitted with lower back pain and malignancy.   Pt with working diagnosis of metastatic prostate cancer; he is currently NPO, awaiting biopsy later today.   Spoke with pt at bedside, who reports decreased appetite and weight loss over the past past 1-2 weeks. He reports consuming 2 meals per day (burger and fries or meat, starch, and vegetable- mostly "country food"). Pt reports 2 meals per day is typical for him, but has been eating less. He reports doing "okay" with meal completion yesterday (PO 85%) and was consuming Ensure supplements.  Pt reveals UBW is 250#, however, is unable to provide further details regarding wt loss ("I don't really keep up with my weight"). He shares that he feels like his clothing is getting looser and noticed depletion in his upper arm areas. 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   Nutrition-Focused physical exam completed. Findings are mild fat depletion, mild muscle depletion, and no edema.   Discussed importance of good meal intake to promote healing.   Labs reviewed: CBGS: 151-162.   Diet Order:  Diet NPO time specified  Skin:  Reviewed, no issues  Last BM:   05/30/16  Height:   Ht Readings from Last 1 Encounters:  05/29/16 5\' 10"  (1.778 m)    Weight:   Wt Readings from Last 1 Encounters:  05/29/16 205 lb (93 kg)    Ideal Body Weight:  75.5 kg  BMI:  Body mass index is 29.41 kg/m.  Estimated Nutritional Needs:   Kcal:  1900-2100  Protein:  100-115 grams  Fluid:  1.9-2.1 L  EDUCATION NEEDS:   Education needs addressed  Cameron Marshall, RD, LDN, CDE Pager: [phone] After hours Pager: [phone]

## 2016-05-31 NOTE — Consult Note (Signed)
Chief Complaint: Patient was seen in consultation today for Left pelvic ymphadenopathy biopsy Chief Complaint  Patient presents with  . Back Pain   at the request of Dr Truitt Merle  Referring Physician(s): Dr Ezzie Dural  Supervising Physician: Arne Cleveland  Patient Status: [facility] - In-pt  History of Present Illness: Cameron Marshall is a 55 y.o. male   Pt with 3 week history of worsening back pain Noted 15 lb wt loss recently Work up  Reveals bony lesions and Lymphadenopathy Enlarged prostate And PSA 1471  CT 10/10: IMPRESSION: 1. Diffusely enlarged and irregular appearing prostate, measuring up to 6.4 cm in AP dimension, with nodularity and extension into the base of the bladder. Findings compatible with metastatic prostate cancer. 2. Enlarged left pelvic sidewall nodes measure up to 3.1 cm in short axis, reflecting metastatic disease. 3. Enlarged retroperitoneal nodes, extending about the aorta and inferior vena cava, and inferiorly from the aortic bifurcation, measuring up to 2.5 cm in short axis. 4. Enlarged nodes noted adjacent to the proximal abdominal aorta under the diaphragmatic crura. 5. Diffuse metastatic disease throughout the visualized osseous structures, with a mixed lytic and sclerotic appearance. Associated slight compression deformity at the superior endplates of M09, L1 and L2. 6. Mild bibasilar atelectasis noted.  Lungs otherwise clear. 7. Mild aortic atherosclerosis noted.  Request made for tissue diagnosis---probable metastatic prostate Ca Per Dr Burr Medico Dr Vernard Gambles has reviewed imaging and approves pelvic LAN bx  Past Medical History:  Diagnosis Date  . Anemia   . Heart murmur    "sometimes" (05/29/2016)  . History of blood transfusion 05/29/2016   anemia  . Type II diabetes mellitus ([place])     Past Surgical History:  Procedure Laterality Date  . NO PAST SURGERIES      Allergies: Review of patient's allergies indicates no known  allergies.  Medications: Prior to Admission medications   Medication Sig Start Date End Date Taking? Authorizing Provider  cyclobenzaprine (FLEXERIL) 10 MG tablet Take 1 tablet (10 mg total) by mouth 2 (two) times daily as needed for muscle spasms. 04/14/16  Yes Jacqualine Mau, NP  diclofenac (VOLTAREN) 75 MG EC tablet Take 1 tablet (75 mg total) by mouth 2 (two) times daily. 04/14/16  Yes Jacqualine Mau, NP  oxyCODONE-acetaminophen (PERCOCET) 10-325 MG tablet Take 1 tablet by mouth every 4 (four) hours as needed for pain. Patient not taking: Reported on 05/29/2016 05/15/16   Konrad Felix, PA     History reviewed. No pertinent family history.  Social History   Social History  . Marital status: Single    Spouse name: N/A  . Number of children: N/A  . Years of education: N/A   Social History Main Topics  . Smoking status: Current Every Day Smoker    Packs/day: 1.00    Years: 39.00  . Smokeless tobacco: Never Used  . Alcohol use Yes     Comment: 05/29/2016 "none since age 6"  . Drug use: No  . Sexual activity: Not Currently   Other Topics Concern  . None   Social History Narrative  . None    Review of Systems: A 12 point ROS discussed and pertinent positives are indicated in the HPI above.  All other systems are negative.  Review of Systems  Constitutional: Positive for activity change and unexpected weight change. Negative for appetite change, fatigue and fever.  Respiratory: Negative for shortness of breath.   Cardiovascular: Negative for chest pain.  Gastrointestinal: Negative  for abdominal pain.  Musculoskeletal: Positive for back pain and gait problem.  Psychiatric/Behavioral: Negative for behavioral problems and confusion.    Vital Signs: BP 126/73   Pulse 97   Temp 98.8 F (37.1 C) (Oral)   Resp 20   Ht _0  (1.778 m)   Wt 205 lb (93 kg)   SpO2 100%   BMI 29.41 kg/m   Physical Exam  Constitutional: He is oriented to person, place, and  time. He appears well-nourished.  Cardiovascular: Normal rate and regular rhythm.   Pulmonary/Chest: Effort normal and breath sounds normal. No respiratory distress.  Abdominal: Soft. Bowel sounds are normal. There is no tenderness.  Musculoskeletal: Normal range of motion. He exhibits tenderness.  Back pain  Neurological: He is alert and oriented to person, place, and time.  Skin: Skin is warm and dry.  Psychiatric: He has a normal mood and affect. His behavior is normal. Judgment and thought content normal.  Nursing note and vitals reviewed.   Mallampati Score:  MD Evaluation Airway: WNL Heart: WNL Abdomen: WNL Chest/ Lungs: WNL ASA  Classification: 3 Mallampati/Airway Score: One  Imaging: Dg Lumbar Spine Complete  Result Date: 05/15/2016 CLINICAL DATA:  Patient with lower back pain after twisting injury 1 month prior. Initial encounter. EXAM: LUMBAR SPINE - COMPLETE 4+ VIEW COMPARISON:  None. FINDINGS: Multilevel degenerative disc disease with bulky anterior endplate osteophytosis. Multilevel facet degenerative changes. Age-indeterminate compression deformities of the superior endplate of the L1 and T05 vertebral bodies. SI joints are unremarkable. Stool throughout the colon. IMPRESSION: Multilevel degenerative disc and facet disease. Age-indeterminate superior endplate compression deformities of the T12 and L1 vertebral bodies. Recommend correlation for point tenderness. These results will be called to the ordering clinician or representative by the Radiologist Assistant, and communication documented in the PACS or zVision Dashboard. Electronically Signed   By: Lovey Newcomer M.D.   On: 05/15/2016 15:54   Ct Chest W Contrast  Result Date: 05/29/2016 CLINICAL DATA:  Diffuse metastatic bony disease noted on lumbar spine CT. Further evaluation requested, to determine source of malignancy. Initial encounter. EXAM: CT CHEST, ABDOMEN, AND PELVIS WITH CONTRAST TECHNIQUE: Multidetector CT  imaging of the chest, abdomen and pelvis was performed following the standard protocol during bolus administration of intravenous contrast. CONTRAST:  122m ISOVUE-300 IOPAMIDOL (ISOVUE-300) INJECTION 61% COMPARISON:  CT of the lumbar spine performed earlier today at 12:03 p.m. FINDINGS: CT CHEST FINDINGS Cardiovascular: Scattered calcification is noted along the aortic arch and descending thoracic aorta. The heart is unremarkable in appearance. The great vessels are within normal limits. Mediastinum/Nodes: The mediastinum is unremarkable in appearance. No mediastinal lymphadenopathy is seen. No pericardial effusion is seen. The visualized portions of the thyroid gland are unremarkable in appearance. No axillary lymphadenopathy is seen. Lungs/Pleura: Mild bibasilar atelectasis is noted. The lungs are otherwise grossly clear. No pleural effusion or pneumothorax is seen. No masses are identified. Musculoskeletal: No acute osseous abnormalities are identified. The visualized musculature is unremarkable in appearance. CT ABDOMEN PELVIS FINDINGS Hepatobiliary: The liver is unremarkable in appearance. The gallbladder is unremarkable in appearance. The common bile duct remains normal in caliber. Pancreas: The pancreas is within normal limits. Spleen: The spleen is unremarkable in appearance. Adrenals/Urinary Tract: The adrenal glands are unremarkable in appearance. The kidneys are within normal limits. There is no evidence of hydronephrosis. No renal or ureteral stones are identified. No perinephric stranding is seen. Stomach/Bowel: The stomach is unremarkable in appearance. The small bowel is within normal limits. The appendix is normal  in caliber, without evidence of appendicitis. The colon is unremarkable in appearance. Vascular/Lymphatic: Mild scattered calcification is noted along the distal abdominal aorta and its branches. Scattered enlarged nodes are noted adjacent to the proximal abdominal aorta under the  diaphragmatic crura. Enlarged retroperitoneal nodes are seen, surrounding the aorta and inferior vena cava, with some degree of mass effect on the inferior vena cava. These nodes measure up to 2.4 cm in short axis. Enlarged nodes also extend inferiorly from the aortic bifurcation, measuring up to 2.5 cm in short axis anterior to the lower lumbar spine. There are also enlarged left-sided pelvic sidewall nodes, measuring up to 3.1 cm in short axis. Reproductive: The prostate is enlarged, measuring up to 6.4 cm in AP dimension, with diffuse nodularity and extension into the base of the bladder. Findings are compatible with metastatic prostate cancer. The bladder is mildly distended and grossly unremarkable in appearance. Other: No additional soft tissue abnormalities are seen. Musculoskeletal: Diffuse metastatic disease is noted throughout the visualized osseous structures, with a mixed lytic and sclerotic appearance. There is associated slight compression deformity at the superior endplates of I78, L1 and L2. IMPRESSION: 1. Diffusely enlarged and irregular appearing prostate, measuring up to 6.4 cm in AP dimension, with nodularity and extension into the base of the bladder. Findings compatible with metastatic prostate cancer. 2. Enlarged left pelvic sidewall nodes measure up to 3.1 cm in short axis, reflecting metastatic disease. 3. Enlarged retroperitoneal nodes, extending about the aorta and inferior vena cava, and inferiorly from the aortic bifurcation, measuring up to 2.5 cm in short axis. 4. Enlarged nodes noted adjacent to the proximal abdominal aorta under the diaphragmatic crura. 5. Diffuse metastatic disease throughout the visualized osseous structures, with a mixed lytic and sclerotic appearance. Associated slight compression deformity at the superior endplates of M76, L1 and L2. 6. Mild bibasilar atelectasis noted.  Lungs otherwise clear. 7. Mild aortic atherosclerosis noted. Electronically Signed   By:  Garald Balding M.D.   On: 05/29/2016 21:51   Ct Lumbar Spine Wo Contrast  Result Date: 05/29/2016 CLINICAL DATA:  55 year old male with back pain, unintentional weight loss nausea, dark urine. Initial encounter. Age indeterminate compression fractures on September lumbar radiographs. EXAM: CT LUMBAR SPINE WITHOUT CONTRAST TECHNIQUE: Multidetector CT imaging of the lumbar spine was performed without intravenous contrast administration. Multiplanar CT image reconstructions were also generated. COMPARISON:  Lumbar radiographs 05/15/2016. FINDINGS: Segmentation: Normal. Alignment: Overall stable vertebral height alignment since September. Straightening of lumbar lordosis. Vertebrae: Severely abnormal bone mineralization throughout the visible spine, consisting of a permeative pattern of destruction in the lower thoracic and upper lumbar levels. Lytic lesion throughout the central L4 vertebral body. Lytic lesion throughout the inferior L5 vertebral body,. Mixed lytic and miliary sclerotic lesions in the visible sacrum and medial iliac bones. Pathologic appearing vertebral body superior endplate fractures of H20, T12, L1, and L2. There also appears to be a pathologic fracture of the left T11 pars and lamina as seen on series 7, image 38. Mild loss of vertebral body height throughout the lower thoracic spine to L2. There could be mild epidural tumor ventrally throughout much of the lower thoracic and lumbar spine. Paraspinal and other soft tissues: Bulky abnormal prevertebral and paraspinal soft tissue appearing contiguous from the lower thoracic spine through the lumbar spine, and maximal in the mid to lower lumbar spine where confluent soft tissue tumor anterior to L4 and L5 measures greater than 2 cm in thickness. At the thoracolumbar junction the appearance  is that of confluent paraspinal inflammation with abnormal prevertebral and retroperitoneal lymph nodes individually up to 16 mm 17 mm short axis. Furthermore  there is bulky left pelvic side wall lymphadenopathy/tumor, 33 mm in thickness (series 4, image 148). The partially visible prostate appears enlarged. Both partially visible kidneys do not appear obstructed. Calcified aortic atherosclerosis. Disc levels: Superimposed chronic lumbar disc, endplate, and posterior element degeneration. IMPRESSION: 1. Diffusely abnormal visible spine with pathologic fractures throughout the visible lower thoracic spine and involving L1 and L2. Lytic lesions in the L4 and L5 vertebral bodies. Mixed lytic and sclerotic lesions throughout the visible sacrum. 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 2. Bulky prevertebral, retroperitoneal, and left pelvic sidewall lymphadenopathy/tumor. Partially visible prostate appears enlarged. The partially visible kidneys do not appear obstructed. 3. 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 The leading differential considerations are Lymphoma, Prostate cancer, and less likely Multiple Myeloma or other primary. Follow-up CT Chest Abdomen and Pelvis with IV and oral contrast probably is the best next step to evaluate further. Electronically Signed   By: Genevie Ann M.D.   On: 05/29/2016 12:25   Ct Abdomen Pelvis W Contrast  Result Date: 05/29/2016 CLINICAL DATA:  Diffuse metastatic bony disease noted on lumbar spine CT. Further evaluation requested, to determine source of malignancy. Initial encounter. EXAM: CT CHEST, ABDOMEN, AND PELVIS WITH CONTRAST TECHNIQUE: Multidetector CT imaging of the chest, abdomen and pelvis was performed following the standard protocol during bolus administration of intravenous contrast. CONTRAST:  125m ISOVUE-300 IOPAMIDOL (ISOVUE-300) INJECTION 61% COMPARISON:  CT of the lumbar spine performed earlier today at 12:03 p.m. FINDINGS: CT CHEST FINDINGS Cardiovascular: Scattered calcification is noted along the aortic arch and descending thoracic aorta.  The heart is unremarkable in appearance. The great vessels are within normal limits. Mediastinum/Nodes: The mediastinum is unremarkable in appearance. No mediastinal lymphadenopathy is seen. No pericardial effusion is seen. The visualized portions of the thyroid gland are unremarkable in appearance. No axillary lymphadenopathy is seen. Lungs/Pleura: Mild bibasilar atelectasis is noted. The lungs are otherwise grossly clear. No pleural effusion or pneumothorax is seen. No masses are identified. Musculoskeletal: No acute osseous abnormalities are identified. The visualized musculature is unremarkable in appearance. CT ABDOMEN PELVIS FINDINGS Hepatobiliary: The liver is unremarkable in appearance. The gallbladder is unremarkable in appearance. The common bile duct remains normal in caliber. Pancreas: The pancreas is within normal limits. Spleen: The spleen is unremarkable in appearance. Adrenals/Urinary Tract: The adrenal glands are unremarkable in appearance. The kidneys are within normal limits. There is no evidence of hydronephrosis. No renal or ureteral stones are identified. No perinephric stranding is seen. Stomach/Bowel: The stomach is unremarkable in appearance. The small bowel is within normal limits. The appendix is normal in caliber, without evidence of appendicitis. The colon is unremarkable in appearance. Vascular/Lymphatic: Mild scattered calcification is noted along the distal abdominal aorta and its branches. Scattered enlarged nodes are noted adjacent to the proximal abdominal aorta under the diaphragmatic crura. Enlarged retroperitoneal nodes are seen, surrounding the aorta and inferior vena cava, with some degree of mass effect on the inferior vena cava. These nodes measure up to 2.4 cm in short axis. Enlarged nodes also extend inferiorly from the aortic bifurcation, measuring up to 2.5 cm in short axis anterior to the lower lumbar spine. There are also enlarged left-sided pelvic sidewall nodes,  measuring up to 3.1 cm in short axis. Reproductive: The prostate is enlarged, measuring up to 6.4 cm in AP  dimension, with diffuse nodularity and extension into the base of the bladder. Findings are compatible with metastatic prostate cancer. The bladder is mildly distended and grossly unremarkable in appearance. Other: No additional soft tissue abnormalities are seen. Musculoskeletal: Diffuse metastatic disease is noted throughout the visualized osseous structures, with a mixed lytic and sclerotic appearance. There is associated slight compression deformity at the superior endplates of S92, L1 and L2. IMPRESSION: 1. Diffusely enlarged and irregular appearing prostate, measuring up to 6.4 cm in AP dimension, with nodularity and extension into the base of the bladder. Findings compatible with metastatic prostate cancer. 2. Enlarged left pelvic sidewall nodes measure up to 3.1 cm in short axis, reflecting metastatic disease. 3. Enlarged retroperitoneal nodes, extending about the aorta and inferior vena cava, and inferiorly from the aortic bifurcation, measuring up to 2.5 cm in short axis. 4. Enlarged nodes noted adjacent to the proximal abdominal aorta under the diaphragmatic crura. 5. Diffuse metastatic disease throughout the visualized osseous structures, with a mixed lytic and sclerotic appearance. Associated slight compression deformity at the superior endplates of Z30, L1 and L2. 6. Mild bibasilar atelectasis noted.  Lungs otherwise clear. 7. Mild aortic atherosclerosis noted. Electronically Signed   By: Garald Balding M.D.   On: 05/29/2016 21:51    Labs:  CBC:  Recent Labs  05/29/16 1002 05/29/16 1221 05/30/16 1014  WBC 15.8* 15.2* 14.5*  HGB 4.6* 4.4* 8.1*  HCT 15.3* 14.7* 24.6*  PLT 157 142* 130*    COAGS: No results for input(s): INR, APTT in the last 8760 hours.  BMP:  Recent Labs  05/15/16 1521 05/29/16 1002 05/30/16 1014  NA 138 135 137  K 3.6 3.4* 3.8  CL 101 98* 104  CO2 _0 GLUCOSE 179* 205* 181*  BUN _1 CALCIUM 10.4* 8.7* 8.7*  CREATININE 1.23 1.23 0.95  GFRNONAA >60 >60 >60  GFRAA >60 >60 >60    LIVER FUNCTION TESTS:  Recent Labs  05/29/16 1002 05/30/16 1014  BILITOT 1.7* 1.9*  AST 15 16  ALT 13* 11*  ALKPHOS 1,156* 1,372*  PROT 6.9 6.8  ALBUMIN 2.8* 2.7*    TUMOR MARKERS: No results for input(s): AFPTM, CEA, CA199, CHROMGRNA in the last 8760 hours.  Assessment and Plan:  Worsening back pain x 3 weeks Wt loss PSA 1471 Enlarged prostate; bony lesions and LAN Now scheduled for pelvic LAN bx in IR Risks and Benefits discussed with the patient including, but not limited to bleeding, infection, damage to adjacent structures or low yield requiring additional tests. All of the patient's questions were answered, patient is agreeable to proceed. Consent signed and in chart.   Thank you for this interesting consult.  I greatly enjoyed meeting Cameron Marshall and look forward to participating in their care.  A copy of this report was sent to the requesting provider on this date.  Electronically Signed: Eartha Vonbehren A 05/31/2016, 10:15 AM   I spent a total of 40 Minutes    in face to face in clinical consultation, greater than 50% of which was counseling/coordinating care for pelvic LAN bx

## 2016-05-31 NOTE — Progress Notes (Signed)
Subjective: Patient was seen by oncology yesterday, they discussed with him different options of treatment. He is taking this news very well, stating this morning that he is going to beat this disease. He is still having back pain aggravated with any moment. He states that when he is in bed pain does not bother him.  Objective:  Vital signs in last 24 hours: Vitals:   05/30/16 0655 05/30/16 1310 05/31/16 0023 05/31/16 0600  BP: 120/63 128/62 (!) 115/52 126/73  Pulse: 91 99 96 97  Resp: 18 18 20 20   Temp: 98.2 F (36.8 C) 98 F (36.7 C) 98.9 F (37.2 C) 98.8 F (37.1 C)  TempSrc: Oral Oral Oral Oral  SpO2: 100% 100% 99% 100%  Weight:      Height:       Gen. well-developed, well-nourished, in no acute distress. Eyes. Conjunctival pallor and mild scleral icterus. Lungs. Clear bilaterally CV. Regular rate and rhythm Abdomen. Soft, nontender, bowel sounds positive. Extremities. No edema, no cyanosis, pulses 2+ bilaterally  Labs. CBC Latest Ref Rng & Units 05/31/2016 05/30/2016 05/29/2016  WBC 4.0 - 10.5 K/uL 14.2(H) 14.5(H) 15.2(H)  Hemoglobin 13.0 - 17.0 g/dL 7.6(L) 8.1(L) 4.4(LL)  Hematocrit 39.0 - 52.0 % 23.4(L) 24.6(L) 14.7(L)  Platelets 150 - 400 K/uL 130(L) 130(L) 142(L)   CMP Latest Ref Rng & Units 05/31/2016 05/30/2016 05/29/2016  Glucose 65 - 99 mg/dL 152(H) 181(H) 205(H)  BUN 6 - 20 mg/dL 7 10 15   Creatinine 0.61 - 1.24 mg/dL 0.82 0.95 1.23  Sodium 135 - 145 mmol/L 136 137 135  Potassium 3.5 - 5.1 mmol/L 3.7 3.8 3.4(L)  Chloride 101 - 111 mmol/L 102 104 98(L)  CO2 22 - 32 mmol/L 25 24 25   Calcium 8.9 - 10.3 mg/dL 8.7(L) 8.7(L) 8.7(L)  Total Protein 6.5 - 8.1 g/dL 6.2(L) 6.8 6.9  Total Bilirubin 0.3 - 1.2 mg/dL 1.4(H) 1.9(H) 1.7(H)  Alkaline Phos 38 - 126 U/L 1,618(H) 1,372(H) 1,156(H)  AST 15 - 41 U/L 14(L) 16 15  ALT 17 - 63 U/L 12(L) 11(L) 13(L)   Kappa/lambda light chains  Order: [id number]  Status:  Final result Visible to patient:  No (Not Released)  Next appt:  None   Ref Range & Units 2d ago  Kappa free light chain 3.3 - 19.4 mg/L 63.6    Lamda free light chains 5.7 - 26.3 mg/L 37.6    Kappa, lamda light chain ratio 0.26 - 1.65 1.69    Comments: (NOTE)         Assessment/Plan:  MR. Harben 55 y.o man,without any significant past medical history,presented to the history of worsening lower back pain.  Metastatic prostate cancer. 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 There was a Questionableable epidural involvement. -Urology consult-they have already seen the patient, we will follow up after the biopsy. -Oncology consult-they saw the patient and recommended to get an MRI of his thoracic and lumbar spine both. And get  CT-guided biopsy. They discussed different options of treatment with the patient, they will start the treatment as an inpatient, before discharge. -MRI of his thoracic and lumbar spine. -Neurovascular check every 4 hourly. -IR was consulted for biopsy, he is going for a CT-guided biopsy today in the afternoon.  Anemia.He was found to have hemoglobin of 4.4. He denies any symptoms. He was given 3 units of packed RBCs. On repeat check his hemoglobin was 8.1. Today it was 7.6. His anemia is most  likely due to his diffuse bone metastasis. -Daily CBC to monitor.  Newly diagnosed diabetes. His recent A1c was 6.7 and his CBG remains between 129-151 -We added sensitive sliding scale for him yesterday.  UTI.His urine culture is not showing any growth. -We will stop his antibiotics.  Diet. Patient is nothing by mouth because of upcoming biopsy. We will restart the regular low-carb diet after the biopsy.  Dispo: Anticipated discharge in approximately 2-3day(s).   Lorella Nimrod, MD 05/31/2016, 11:39 AM Pager: [phone]

## 2016-05-31 NOTE — Progress Notes (Signed)
Pt down for MRI. MRI tech contacted nurse to report pt being anxious for testing. MD contacted. New orders for ativan. MD reported placing oral and IV order for pt to decide which route he preferred. Nurse down to administer medication. Per MAR IV ativan given. Will follow up with MRI regarding pt.

## 2016-05-31 NOTE — Procedures (Signed)
CT core bx L ext iliac LAN 18g cores to surg path No complication No blood loss. See complete dictation in [facility].

## 2016-05-31 NOTE — Progress Notes (Signed)
Verbal orders obtained to advance diet to carb mod/ heart healthy per verbal order Reesa Chew, MD.

## 2016-05-31 NOTE — Progress Notes (Signed)
Chaplain presented to the patient, introduction made as Chaplain. Dialogued with the patient regarding his current medical status as he speaks of possible cancer diagnosis.  Chaplain offered him encouragement, prayer of healing and wholeness. We dialogued about the fear and anxiety associated with just the mention of a cancer diagnosis, encourged him to develop open communication with his support community such family, friends, and faith community for ongoing support. Chaplain will follow for spiritual care support as needed. Chaplain AudreyTthornton   [phone]

## 2016-05-31 NOTE — Progress Notes (Signed)
Pt reported needing something for pain after being transferred to chair. Md contacted. New order placed for IV morphine, however, pt refused medication at scheduled time. Pt reported he was getting comfortable after chair was reclined. Will continue to monitor pain.

## 2016-05-31 NOTE — Progress Notes (Signed)
Reviewed oncology notes and agree with plan/biopsy Will follow Please have patient see Dr Alyson Ingles in about 1 month

## 2016-05-31 NOTE — Sedation Documentation (Signed)
Bandaid L lower buttock area intact

## 2016-06-01 ENCOUNTER — Other Ambulatory Visit: Payer: Self-pay | Admitting: Hematology

## 2016-06-01 DIAGNOSIS — C775 Secondary and unspecified malignant neoplasm of intrapelvic lymph nodes: Secondary | ICD-10-CM

## 2016-06-01 DIAGNOSIS — C61 Malignant neoplasm of prostate: Principal | ICD-10-CM

## 2016-06-01 DIAGNOSIS — C7951 Secondary malignant neoplasm of bone: Secondary | ICD-10-CM

## 2016-06-01 DIAGNOSIS — D72829 Elevated white blood cell count, unspecified: Secondary | ICD-10-CM

## 2016-06-01 DIAGNOSIS — R748 Abnormal levels of other serum enzymes: Secondary | ICD-10-CM

## 2016-06-01 LAB — CBC
HCT: 25.6 % — ABNORMAL LOW (ref 39.0–52.0)
HEMATOCRIT: 25.1 % — AB (ref 39.0–52.0)
Hemoglobin: 7.9 g/dL — ABNORMAL LOW (ref 13.0–17.0)
Hemoglobin: 8.1 g/dL — ABNORMAL LOW (ref 13.0–17.0)
MCH: 28.1 pg (ref 26.0–34.0)
MCH: 28.2 pg (ref 26.0–34.0)
MCHC: 31.5 g/dL (ref 30.0–36.0)
MCHC: 31.6 g/dL (ref 30.0–36.0)
MCV: 89.3 fL (ref 78.0–100.0)
MCV: 89.2 fL (ref 78.0–100.0)
PLATELETS: 124 10*3/uL — AB (ref 150–400)
Platelets: 124 K/uL — ABNORMAL LOW (ref 150–400)
RBC: 2.81 MIL/uL — AB (ref 4.22–5.81)
RBC: 2.87 MIL/uL — ABNORMAL LOW (ref 4.22–5.81)
RDW: 18.2 % — ABNORMAL HIGH (ref 11.5–15.5)
RDW: 18.2 % — ABNORMAL HIGH (ref 11.5–15.5)
WBC: 13.9 10*3/uL — AB (ref 4.0–10.5)
WBC: 13.2 K/uL — ABNORMAL HIGH (ref 4.0–10.5)

## 2016-06-01 LAB — COMPREHENSIVE METABOLIC PANEL
ALT: 12 U/L — AB (ref 17–63)
AST: 19 U/L (ref 15–41)
Albumin: 2.7 g/dL — ABNORMAL LOW (ref 3.5–5.0)
Alkaline Phosphatase: 1895 U/L — ABNORMAL HIGH (ref 38–126)
Anion gap: 8 (ref 5–15)
BUN: 5 mg/dL — AB (ref 6–20)
CHLORIDE: 102 mmol/L (ref 101–111)
CO2: 27 mmol/L (ref 22–32)
CREATININE: 0.82 mg/dL (ref 0.61–1.24)
Calcium: 8.5 mg/dL — ABNORMAL LOW (ref 8.9–10.3)
GFR calc Af Amer: >60 mL/min (ref >60)
Glucose, Bld: 168 mg/dL — ABNORMAL HIGH (ref 65–99)
POTASSIUM: 3.8 mmol/L (ref 3.5–5.1)
SODIUM: 137 mmol/L (ref 135–145)
Total Bilirubin: 1.4 mg/dL — ABNORMAL HIGH (ref 0.3–1.2)
Total Protein: 6.2 g/dL — ABNORMAL LOW (ref 6.5–8.1)

## 2016-06-01 LAB — IMMUNOFIXATION ELECTROPHORESIS
IGA: 165 mg/dL (ref 90–386)
IGG (IMMUNOGLOBIN G), SERUM: 1071 mg/dL (ref 700–1600)
IgM, Serum: 30 mg/dL (ref 20–172)
TOTAL PROTEIN ELP: 5.8 g/dL — AB (ref 6.0–8.5)

## 2016-06-01 LAB — GLUCOSE, CAPILLARY
GLUCOSE-CAPILLARY: 146 mg/dL — AB (ref 65–99)
Glucose-Capillary: 226 mg/dL — ABNORMAL HIGH (ref 65–99)
Glucose-Capillary: 203 mg/dL — ABNORMAL HIGH (ref 65–99)
Glucose-Capillary: 212 mg/dL — ABNORMAL HIGH (ref 65–99)

## 2016-06-01 MED ORDER — BICALUTAMIDE 50 MG PO TABS
50.0000 mg | ORAL_TABLET | Freq: Every day | ORAL | Status: DC
  Administered 2016-06-02: 50 mg via ORAL
  Filled 2016-06-01: qty 1

## 2016-06-01 MED ORDER — LEUPROLIDE ACETATE 7.5 MG IM KIT
7.5000 mg | PACK | Freq: Once | INTRAMUSCULAR | Status: AC
  Administered 2016-06-02: 7.5 mg via INTRAMUSCULAR
  Filled 2016-06-01: qty 7.5

## 2016-06-01 NOTE — Progress Notes (Signed)
Pt has no IV access. Pt pulled line during previous shift. New line was placed this morning by IV team. Tech in room and noticed IV was partially removed. IV completely removed and noted. MD contacted and reported IV site was not needed. Pt does not have orders for fluids or IV meds. Pt made aware.

## 2016-06-01 NOTE — Evaluation (Signed)
Physical Therapy Evaluation Patient Details Name: Cameron Marshall MRN: [id number] DOB: 17-Aug-1961 Today's Date: 06/01/2016   History of Present Illness  pt is a 55 y/o male without significant medical history (not regularly followed by MD) who presented with worsening lower back pain, starting 3 weeks ago.  CT showed enlarged and irregular prostate with extension to the bladder, finding compatible with metastatic prostate CA.  MRI shows abnomal bone marrow signal compatible with metastatic disease and suspicion of intrathecal metastatic disease.  Left pelvic lymphadenopathy biopsy taken.  Clinical Impression  Pt admitted with/for intractable back pain determined to be due to metastatic prostate CA.  Pt currently limited functionally due to the problems listed below.  (see problems list.)  Pt will benefit from PT to maximize function and safety to be able to get home safely with available assist of room mate. .     Follow Up Recommendations Home health PT;Supervision for mobility/OOB    Equipment Recommendations  Rolling walker with 5" wheels    Recommendations for Other Services       Precautions / Restrictions Precautions Precautions: Fall      Mobility  Bed Mobility Overal bed mobility: Needs Assistance Bed Mobility: Rolling;Sidelying to Sit;Sit to Sidelying Rolling: Min guard Sidelying to sit: Min guard     Sit to sidelying: Min guard General bed mobility comments: cues for safest technique. pt will need some review  Transfers Overall transfer level: Needs assistance Equipment used: Rolling walker (2 wheeled) Transfers: Sit to/from Stand Sit to Stand: Min guard         General transfer comment: cues for hand placement and general transfer safety  Ambulation/Gait Ambulation/Gait assistance: Min guard Ambulation Distance (Feet): 140 Feet Assistive device: Rolling walker (2 wheeled) Gait Pattern/deviations: Step-through pattern;Trunk flexed Gait velocity:  slower Gait velocity interpretation: Below normal speed for age/gender General Gait Details: foward lean over RW with heavy use of UE's.  Generally steady with mild sway and drift  Stairs            Wheelchair Mobility    Modified Rankin (Stroke Patients Only)       Balance Overall balance assessment: Needs assistance Sitting-balance support: No upper extremity supported Sitting balance-Leahy Scale: Fair     Standing balance support: Bilateral upper extremity supported Standing balance-Leahy Scale: Fair (to poor) Standing balance comment: reliant on the RW or external support for pain control                             Pertinent Vitals/Pain Pain Assessment: Faces Faces Pain Scale: Hurts little more Pain Location: back Pain Descriptors / Indicators: Other (Comment) (stiff) Pain Intervention(s): Patient requesting pain meds-RN notified;Monitored during session;Repositioned    Home Living Family/patient expects to be discharged to:: Private residence Living Arrangements: Non-relatives/Friends Available Help at Discharge: Available 24 hours/day;Other (Comment) (room mate) Type of Home: House Home Access: Stairs to enter   [employer] of Steps: 2 Home Layout: One level Home Equipment: None      Prior Function Level of Independence: Independent               Hand Dominance        Extremity/Trunk Assessment   Upper Extremity Assessment: Defer to OT evaluation           Lower Extremity Assessment: Overall WFL for tasks assessed (mild R proximal weakness and mild R knee instability)         Communication  Communication: No difficulties  Cognition Arousal/Alertness: Awake/alert Behavior During Therapy: WFL for tasks assessed/performed Overall Cognitive Status: Within Functional Limits for tasks assessed                      General Comments      Exercises     Assessment/Plan    PT Assessment Patient needs  continued PT services  PT Problem List Decreased strength;Decreased activity tolerance;Decreased balance;Decreased mobility;Decreased knowledge of use of DME;Pain;Decreased knowledge of precautions          PT Treatment Interventions Gait training;DME instruction;Functional mobility training;Therapeutic activities;Stair training;Patient/family education    PT Goals (Current goals can be found in the Care Plan section)  Acute Rehab PT Goals Patient Stated Goal: I'm going to beat this.Marland KitchenMarland KitchenMarland KitchenWant to be independent PT Goal Formulation: With patient Time For Goal Achievement: Jul 05, 2016 Potential to Achieve Goals: Good    Frequency Min 3X/week   Barriers to discharge        Co-evaluation PT/OT/SLP Co-Evaluation/Treatment: Yes Reason for Co-Treatment: Complexity of the patient's impairments (multi-system involvement) PT goals addressed during session: Mobility/safety with mobility         End of Session   Activity Tolerance: Patient tolerated treatment well Patient left: in bed;with call bell/phone within reach Nurse Communication: Mobility status         Time: [id number] PT Time Calculation (min) (ACUTE ONLY): 18 min   Charges:   PT Evaluation $PT Eval Low Complexity: 1 Procedure     PT G Codes:        Avamae Dehaan, Tessie Fass 06/01/2016, 6:14 PM 06/01/2016  Donnella Sham, PT [phone]  (pager)

## 2016-06-01 NOTE — Progress Notes (Signed)
Cameron Marshall   DOB:03-19-1961   [id number]   [id number]  Oncology follow-up note  Subjective: Patient underwent pelvic lymph node biopsy by interventional radiology yesterday, tolerated procedure well. He had spine MRI today, but could not finish due to his pain when he laid flat. He still has pain when he walks.    Objective:  Vitals:   06/01/16 1623 06/01/16 2203  BP: (!) 127/55 130/66  Pulse: 100 (!) 101  Resp: 19 18  Temp: 98.8 F (37.1 C) 99.3 F (37.4 C)    Body mass index is 29.41 kg/m.  Intake/Output Summary (Last 24 hours) at 06/01/16 2303 Last data filed at 06/01/16 2041  Gross per 24 hour  Intake                3 ml  Output             1100 ml  Net            -1097 ml     Sclerae unicteric  Oropharynx clear  No peripheral adenopathy  Lungs clear -- no rales or rhonchi  Heart regular rate and rhythm  Abdomen benign  MSK no focal spinal tenderness, no peripheral edema  Neuro nonfocal    CBG (last 3)   Recent Labs  06/01/16 1234 06/01/16 1622 06/01/16 2219  GLUCAP 226* 203* 212*     Labs:  CBC Latest Ref Rng & Units 06/01/2016 06/01/2016 05/31/2016  WBC 4.0 - 10.5 K/uL 13.2(H) 13.9(H) 14.4(H)  Hemoglobin 13.0 - 17.0 g/dL 8.1(L) 7.9(L) 8.1(L)  Hematocrit 39.0 - 52.0 % 25.6(L) 25.1(L) 25.2(L)  Platelets 150 - 400 K/uL 124(L) 124(L) 123(L)   CMP Latest Ref Rng & Units 06/01/2016 05/31/2016 05/30/2016  Glucose 65 - 99 mg/dL 168(H) 152(H) 181(H)  BUN 6 - 20 mg/dL 5(L) 7 10  Creatinine 0.61 - 1.24 mg/dL 0.82 0.82 0.95  Sodium 135 - 145 mmol/L 137 136 137  Potassium 3.5 - 5.1 mmol/L 3.8 3.7 3.8  Chloride 101 - 111 mmol/L 102 102 104  CO2 22 - 32 mmol/L 27 25 24   Calcium 8.9 - 10.3 mg/dL 8.5(L) 8.7(L) 8.7(L)  Total Protein 6.5 - 8.1 g/dL 6.2(L) 6.2(L) 6.8  Total Bilirubin 0.3 - 1.2 mg/dL 1.4(H) 1.4(H) 1.9(H)  Alkaline Phos 38 - 126 U/L 1,895(H) 1,618(H) 1,372(H)  AST 15 - 41 U/L 19 14(L) 16  ALT 17 - 63 U/L 12(L) 12(L) 11(L)    Urine  Studies No results for input(s): UHGB, CRYS in the last 72 hours.  Invalid input(s): UACOL, UAPR, USPG, UPH, UTP, UGL, UKET, UBIL, UNIT, UROB, ULEU, UEPI, UWBC, [name], [name]  Basic Metabolic Panel:  Recent Labs Lab 05/29/16 1002 05/30/16 1014 05/31/16 0905 06/01/16 0659  NA 135 137 136 137  K 3.4* 3.8 3.7 3.8  CL 98* 104 102 102  CO2 25 24 25 27   GLUCOSE 205* 181* 152* 168*  BUN 15 10 7  5*  CREATININE 1.23 0.95 0.82 0.82  CALCIUM 8.7* 8.7* 8.7* 8.5*   GFR Estimated Creatinine Clearance: 116.6 mL/min (by C-G formula based on SCr of 0.82 mg/dL). Liver Function Tests:  Recent Labs Lab 05/29/16 1002 05/30/16 1014 05/31/16 0905 06/01/16 0659  AST 15 16 14* 19  ALT 13* 11* 12* 12*  ALKPHOS 1,156* 1,372* 1,618* 1,895*  BILITOT 1.7* 1.9* 1.4* 1.4*  PROT 6.9 6.8 6.2* 6.2*  ALBUMIN 2.8* 2.7* 2.7* 2.7*    Recent Labs Lab 05/29/16 1002  LIPASE 21   No  results for input(s): AMMONIA in the last 168 hours. Coagulation profile  Recent Labs Lab 05/31/16 0954  INR 1.28    CBC:  Recent Labs Lab 05/29/16 1002 05/29/16 1221 05/30/16 1014 05/31/16 0905 05/31/16 2046 06/01/16 0659 06/01/16 1714  WBC 15.8* 15.2* 14.5* 14.2* 14.4* 13.9* 13.2*  NEUTROABS 10.5* 10.5* 8.9* 9.0*  --   --   --   HGB 4.6* 4.4* 8.1* 7.6* 8.1* 7.9* 8.1*  HCT 15.3* 14.7* 24.6* 23.4* 25.2* 25.1* 25.6*  MCV 91.6 91.9 86.9 87.3 88.4 89.3 89.2  PLT 157 142* 130* 130* 123* 124* 124*   Cardiac Enzymes: No results for input(s): CKTOTAL, CKMB, CKMBINDEX, TROPONINI in the last 168 hours. BNP: Invalid input(s): POCBNP CBG:  Recent Labs Lab 05/31/16 2206 06/01/16 0820 06/01/16 1234 06/01/16 1622 06/01/16 2219  GLUCAP 166* 146* 226* 203* 212*   D-Dimer No results for input(s): DDIMER in the last 72 hours. Hgb A1c No results for input(s): HGBA1C in the last 72 hours. Lipid Profile No results for input(s): CHOL, HDL, LDLCALC, TRIG, CHOLHDL, LDLDIRECT in the last 72  hours. Thyroid function studies No results for input(s): TSH, T4TOTAL, T3FREE, THYROIDAB in the last 72 hours.  Invalid input(s): FREET3 Anemia work up No results for input(s): VITAMINB12, FOLATE, FERRITIN, TIBC, IRON, RETICCTPCT in the last 72 hours. Microbiology Recent Results (from the past 240 hour(s))  Urine culture     Status: None   Collection Time: 05/29/16 11:38 AM  Result Value Ref Range Status   Specimen Description URINE, RANDOM  Final   Special Requests NONE  Final   Culture NO GROWTH  Final   Report Status 05/30/2016 FINAL  Final   PATHOLOGY REPORT  Diagnosis 05/31/2016 Lymph node, needle/core biopsy, Left ext iliac LAN - METASTATIC ADENOCARCINOMA, SEE COMMENT. Microscopic Comment The patient's history combined with the morphology are consistent with metastatic prostate cancer. The case was called to Dr. Burr Medico on 06/01/2016.   Studies:  Mr Thoracic Spine Wo Contrast  Result Date: 05/31/2016 CLINICAL DATA:  55 year old male with osseous metastatic disease and bulky retroperitoneal and pelvic adenopathy. Possible prostate cancer primary. Status post CT-guided left pelvic adenopathy core biopsy today. Subsequent encounter. EXAM: MRI THORACIC AND LUMBAR SPINE WITHOUT CONTRAST TECHNIQUE: Multiplanar and multiecho pulse sequences of the thoracic and lumbar spine were obtained without intravenous contrast. COMPARISON:  CT chest abdomen and pelvis 05/29/2016 and lumbar spine CT 05/29/2016. FINDINGS: Thoracic and lumbar MRI without and with contrast was requested, but the patient was only able to tolerate lumbar spine without contrast and limited sagittal imaging of the thoracic spine without contrast. MRI THORACIC SPINE FINDINGS Limited sagittal imaging of the cervical spine obtained for thoracic spine counting and demonstrates diffusely abnormal cervical spine marrow signal, the same as that in the thoracic spine. Diffusely abnormal thoracic spine marrow signal consisting of  profoundly decreased T1 and T2 signal. T11 and T12 level mild superior endplate fractures re- demonstrated. There appears to be a mild degree of generalized thoracic spinal stenosis which might be related to mild widespread thoracic epidural disease, on certain. There is no convincing thoracic spinal cord compression or thoracic spinal cord signal abnormality. MRI LUMBAR SPINE FINDINGS Near diffuse profoundly abnormal skeleton marrow signal with decreased T1, T2, and stir signal. Normal lumbar segmentation. Stable lumbar vertebral height and alignment with mild pathologic fractures of the L1 and L2 superior endplates again noted. Widespread lumbar spinal stenosis which appears more related to increased epidural fat than to severe epidural tumor, however, there could be  diffuse thickening of the cauda equina nerve roots which are not resolved separately anywhere in the lumbar thecal sac. Partially visible urinary bladder enlargement. Diffuse retroperitoneal and prevertebral lymphadenopathy. Diffuse abnormal increased STIR signal in the lumbar paraspinal muscles. IMPRESSION: 1. The examination had to be discontinued prior to completion due to spine pain. No axial imaging of the thoracic spine and no postcontrast imaging was obtained. 2. Diffuse profound abnormal bone marrow signal compatible with diffuse skeletal metastatic disease. Mild pathologic fractures re- demonstrated in the lower thoracic and upper lumbar spine. 3. Widespread thoracic and lumbar spinal stenosis but no thoracic cord compression at this time. No thoracic spinal cord edema. 4. Suspicion of intrathecal metastatic disease resulting in diffuse cauda equina thickening. Electronically Signed   By: Genevie Ann M.D.   On: 05/31/2016 20:14   Mr Lumbar Spine Wo Contrast  Result Date: 05/31/2016 CLINICAL DATA:  55 year old male with osseous metastatic disease and bulky retroperitoneal and pelvic adenopathy. Possible prostate cancer primary. Status post  CT-guided left pelvic adenopathy core biopsy today. Subsequent encounter. EXAM: MRI THORACIC AND LUMBAR SPINE WITHOUT CONTRAST TECHNIQUE: Multiplanar and multiecho pulse sequences of the thoracic and lumbar spine were obtained without intravenous contrast. COMPARISON:  CT chest abdomen and pelvis 05/29/2016 and lumbar spine CT 05/29/2016. FINDINGS: Thoracic and lumbar MRI without and with contrast was requested, but the patient was only able to tolerate lumbar spine without contrast and limited sagittal imaging of the thoracic spine without contrast. MRI THORACIC SPINE FINDINGS Limited sagittal imaging of the cervical spine obtained for thoracic spine counting and demonstrates diffusely abnormal cervical spine marrow signal, the same as that in the thoracic spine. Diffusely abnormal thoracic spine marrow signal consisting of profoundly decreased T1 and T2 signal. T11 and T12 level mild superior endplate fractures re- demonstrated. There appears to be a mild degree of generalized thoracic spinal stenosis which might be related to mild widespread thoracic epidural disease, on certain. There is no convincing thoracic spinal cord compression or thoracic spinal cord signal abnormality. MRI LUMBAR SPINE FINDINGS Near diffuse profoundly abnormal skeleton marrow signal with decreased T1, T2, and stir signal. Normal lumbar segmentation. Stable lumbar vertebral height and alignment with mild pathologic fractures of the L1 and L2 superior endplates again noted. Widespread lumbar spinal stenosis which appears more related to increased epidural fat than to severe epidural tumor, however, there could be diffuse thickening of the cauda equina nerve roots which are not resolved separately anywhere in the lumbar thecal sac. Partially visible urinary bladder enlargement. Diffuse retroperitoneal and prevertebral lymphadenopathy. Diffuse abnormal increased STIR signal in the lumbar paraspinal muscles. IMPRESSION: 1. The examination had  to be discontinued prior to completion due to spine pain. No axial imaging of the thoracic spine and no postcontrast imaging was obtained. 2. Diffuse profound abnormal bone marrow signal compatible with diffuse skeletal metastatic disease. Mild pathologic fractures re- demonstrated in the lower thoracic and upper lumbar spine. 3. Widespread thoracic and lumbar spinal stenosis but no thoracic cord compression at this time. No thoracic spinal cord edema. 4. Suspicion of intrathecal metastatic disease resulting in diffuse cauda equina thickening. Electronically Signed   By: Genevie Ann M.D.   On: 05/31/2016 20:14   Ct Biopsy  Result Date: 05/31/2016 CLINICAL DATA:  Retroperitoneal and left iliac adenopathy. Osseous metastatic disease. EXAM: CT GUIDED CORE BIOPSY OF LEFT INTERNAL ILIAC ADENOPATHY ANESTHESIA/SEDATION: Intravenous Fentanyl and Versed were administered as conscious sedation during continuous monitoring of the patient's level of consciousness and physiological / cardiorespiratory status  by the radiology RN, with a total moderate sedation time of 10 minutes. PROCEDURE: The procedure risks, benefits, and alternatives were explained to the patient. Questions regarding the procedure were encouraged and answered. The patient understands and consents to the procedure. Patient placed prone. Select axial scans through the pelvis obtained. Representative left internal iliac adenopathy was localized and an appropriate skin entry site determined and marked. The operative field was prepped with chlorhexidinein a sterile fashion, and a sterile drape was applied covering the operative field. A sterile gown and sterile gloves were used for the procedure. Local anesthesia was provided with 1% Lidocaine. Under CT fluoroscopic guidance, a 17 gauge trocar needle was advanced to the margin of the lesion. Once needle tip position was confirmed, coaxial 18-gauge core biopsy samples were obtained, submitted in saline to surgical  pathology. The guide needle was removed. Postprocedure scans show no hemorrhage or other apparent complication. The patient tolerated the procedure well. COMPLICATIONS: None immediate FINDINGS: Bulky bilateral common iliac in bilateral internal iliac adenopathy was again identified. Representative CT-guided core biopsy of left internal iliac adenopathy. IMPRESSION: 1. Technically successful CT-guided left iliac adenopathy core biopsy. Electronically Signed   By: Lucrezia Europe M.D.   On: 05/31/2016 17:12    Assessment:  55 year old African-American male, with past medical history of hypertension, who has not seen a doctor for a number of years, presented with back pain for 3 weeks.  1. 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 Severe anemia, likely related to his diffuse bone metastasis 3. Multiple pathological fracture of T11-L2 4. HTN 5. Hyperglycemia  6. Leukocytosis, secondary infection (UTI) vs reactive to underlying malignancy 7. Mild bilirubinemia   Recommendations: -I discussed his lymph node biopsy result, and his spinal MRI findings. -We discussed that his metastatic breast cancer is not curable, but very treatable -I recommend him to start Lupron 7.5mg  im for androgen deprivation, I ordered for him today  -bicalutamide 50mg  daily, start tomorrow, and please give him a prescription with one month supply. It is very important to continue to prevent lupron induced LH surge and disease flare  -we will start him on abiraterone as outpt  -I will set up his follow up appointment with my colleague Dr. Alen Blew in 1-2 weeks  -He knows to call us if he has any question before his f/u appointment.    Truitt Merle, MD 06/01/2016

## 2016-06-01 NOTE — Evaluation (Signed)
Occupational Therapy Evaluation Patient Details Name: Cameron Marshall MRN: [id number] DOB: 08/11/1961 Today's Date: 06/01/2016    History of Present Illness pt is a 55 y/o male without significant medical history (not regularly followed by MD) who presented with worsening lower back pain, starting 3 weeks ago.  CT showed enlarged and irregular prostate with extension to the bladder, finding compatible with metastatic prostate CA.  MRI shows abnomal bone marrow signal compatible with metastatic disease and suspicion of intrathecal metastatic disease.  Left pelvic lymphadenopathy biopsy taken.   Clinical Impression   Pt admitted with above. He demonstrates the below listed deficits and will benefit from continued OT to maximize safety and independence with BADLs.  Pt requires mod - max A for ADLs.  Will benefit from adaptive equipment to allow him to perform LB ADLs.  He reports his house mate can provide assist as needed.  Recommend HHOT at discharge.       Follow Up Recommendations  Home health OT;Supervision/Assistance - 24 hour    Equipment Recommendations  3 in 1 bedside comode;Tub/shower bench    Recommendations for Other Services       Precautions / Restrictions Precautions Precautions: Fall Precaution Comments: instructed pt in back precautions for comfort       Mobility Bed Mobility Overal bed mobility: Needs Assistance Bed Mobility: Rolling;Sidelying to Sit;Sit to Supine Rolling: Min guard Sidelying to sit: Min guard   Sit to supine: Min assist Sit to sidelying: Min guard General bed mobility comments: cues for safest technique. pt will need some review  Transfers Overall transfer level: Needs assistance Equipment used: Rolling walker (2 wheeled) Transfers: Sit to/from [facility] Sit to Stand: Min guard         General transfer comment: cues for hand placement and general transfer safety    Balance Overall balance assessment: Needs  assistance Sitting-balance support: No upper extremity supported Sitting balance-Leahy Scale: Fair     Standing balance support: Bilateral upper extremity supported Standing balance-Leahy Scale: Fair (to poor) Standing balance comment: reliant on the RW or external support for pain control                            ADL Overall ADL's : Needs assistance/impaired Eating/Feeding: Independent   Grooming: Wash/dry hands;Wash/dry face;Oral care;Brushing hair;Set up;Sitting;Bed level   Upper Body Bathing: Supervision/ safety;Set up;Sitting;Bed level   Lower Body Bathing: Maximal assistance;Sit to/from stand Lower Body Bathing Details (indicate cue type and reason): Pt unable to access bleow knees and difficulty maintaining balance when standing  Upper Body Dressing : Set up;Supervision/safety;Sitting   Lower Body Dressing: Maximal assistance;Sit to/from stand Lower Body Dressing Details (indicate cue type and reason): uanble to access feet  Toilet Transfer: Minimal assistance;Ambulation;Comfort height toilet;Regular Toilet;Grab bars;RW   Toileting- Clothing Manipulation and Hygiene: Moderate assistance;Sit to/from stand       Functional mobility during ADLs: Minimal assistance;+2 for safety/equipment;Rolling walker General ADL Comments: Pt instructed in back precautions to reduce stress to back and reduce pain      Vision     Perception     Praxis      Pertinent Vitals/Pain Pain Assessment: Faces Faces Pain Scale: Hurts little more Pain Location: back Pain Descriptors / Indicators: Tightness Pain Intervention(s): Monitored during session;Repositioned;Limited activity within patient's tolerance     Hand Dominance     Extremity/Trunk Assessment Upper Extremity Assessment Upper Extremity Assessment: Overall WFL for tasks assessed   Lower  Extremity Assessment Lower Extremity Assessment: Defer to PT evaluation       Communication  Communication Communication: No difficulties   Cognition Arousal/Alertness: Awake/alert Behavior During Therapy: WFL for tasks assessed/performed Overall Cognitive Status: Within Functional Limits for tasks assessed                     General Comments       Exercises       Shoulder Instructions      Home Living Family/patient expects to be discharged to:: Private residence Living Arrangements: Non-relatives/Friends Available Help at Discharge: Available 24 hours/day;Other (Comment) (room mate) Type of Home: House Home Access: Stairs to enter [employer] of Steps: 2   Home Layout: One level     Bathroom Shower/Tub: Tub/shower unit Shower/tub characteristics: Architectural technologist: Handicapped height     Home Equipment: None          Prior Functioning/Environment Level of Independence: Independent        Comments: but was recently "just getting by" due to back pain        OT Problem List: Decreased strength;Decreased activity tolerance;Impaired balance (sitting and/or standing);Decreased safety awareness;Decreased knowledge of use of DME or AE;Decreased knowledge of precautions;Pain   OT Treatment/Interventions: Self-care/ADL training;DME and/or AE instruction;Therapeutic activities;Patient/family education;Balance training    OT Goals(Current goals can be found in the care plan section) Acute Rehab OT Goals Patient Stated Goal: I'm going to beat this.Marland KitchenMarland KitchenMarland KitchenWant to be independent OT Goal Formulation: With patient Time For Goal Achievement: 30-Jun-2016 Potential to Achieve Goals: Good ADL Goals Pt Will Perform Lower Body Bathing: with supervision;sit to/from stand;with adaptive equipment Pt Will Perform Lower Body Dressing: with supervision;with adaptive equipment;sit to/from stand Pt Will Transfer to Toilet: with supervision;ambulating;regular height toilet;bedside commode;grab bars Pt Will Perform Toileting - Clothing Manipulation and  hygiene: with supervision;sit to/from stand;with adaptive equipment Pt Will Perform Tub/Shower Transfer: Tub transfer;with min guard assist;ambulating;tub bench;rolling walker  OT Frequency: Min 2X/week   Barriers to D/C: Decreased caregiver support          Co-evaluation PT/OT/SLP Co-Evaluation/Treatment: Yes Reason for Co-Treatment: For patient/therapist safety PT goals addressed during session: Mobility/safety with mobility OT goals addressed during session: ADL's and self-care      End of Session Equipment Utilized During Treatment: Rolling walker Nurse Communication: Mobility status;Patient requests pain meds  Activity Tolerance: Patient limited by pain Patient left: in bed;with call bell/phone within reach;with bed alarm set   Time: 1716-1734 OT Time Calculation (min): 18 min Charges:  OT General Charges $OT Visit: 1 Procedure OT Evaluation $OT Eval Moderate Complexity: 1 Procedure G-Codes:    Ora Bollig M June 16, 2016, 7:20 PM

## 2016-06-01 NOTE — Progress Notes (Signed)
Subjective: Patient did not had any much complaints except that his lower back was hurting. He wants to go home. He also wants to  walk in asking for some assistance, his walking aggravate his pain. He has his CT-guided biopsy and MRI done yesterday. He was unable to complete his MRI because of back pain. Objective:  Vital signs in last 24 hours: Vitals:   05/31/16 1615 05/31/16 2015 06/01/16 0017 06/01/16 0435  BP: (!) 122/53 (!) 142/71 (!) 141/68 131/69  Pulse: (!) 110 (!) 105 (!) 108 100  Resp: 18 18 18 18   Temp:  100 F (37.8 C) 98.4 F (36.9 C) 98.4 F (36.9 C)  TempSrc:  Oral Oral Oral  SpO2: 97% 100% 95% 98%  Weight:      Height:       Gen.well-developed, well-nourished, in no acute distress. Eyes. Conjunctival pallor and mild scleral icterus. Lungs.Clear bilaterally CV.Regular rate and rhythm Abdomen.Soft, nontender, bowel sounds positive. Extremities.No edema, no cyanosis, pulses 2+ bilaterally  Labs. CBC Latest Ref Rng & Units 06/01/2016 05/31/2016 05/31/2016  WBC 4.0 - 10.5 K/uL 13.9(H) 14.4(H) 14.2(H)  Hemoglobin 13.0 - 17.0 g/dL 7.9(L) 8.1(L) 7.6(L)  Hematocrit 39.0 - 52.0 % 25.1(L) 25.2(L) 23.4(L)  Platelets 150 - 400 K/uL 124(L) 123(L) 130(L)   CMP Latest Ref Rng & Units 06/01/2016 05/31/2016 05/30/2016  Glucose 65 - 99 mg/dL 168(H) 152(H) 181(H)  BUN 6 - 20 mg/dL 5(L) 7 10  Creatinine 0.61 - 1.24 mg/dL 0.82 0.82 0.95  Sodium 135 - 145 mmol/L 137 136 137  Potassium 3.5 - 5.1 mmol/L 3.8 3.7 3.8  Chloride 101 - 111 mmol/L 102 102 104  CO2 22 - 32 mmol/L 27 25 24   Calcium 8.9 - 10.3 mg/dL 8.5(L) 8.7(L) 8.7(L)  Total Protein 6.5 - 8.1 g/dL 6.2(L) 6.2(L) 6.8  Total Bilirubin 0.3 - 1.2 mg/dL 1.4(H) 1.4(H) 1.9(H)  Alkaline Phos 38 - 126 U/L 1,895(H) 1,618(H) 1,372(H)  AST 15 - 41 U/L 19 14(L) 16  ALT 17 - 63 U/L 12(L) 12(L) 11(L)   MRI of thoracic or lumbar spine. IMPRESSION: 1. The examination had to be discontinued prior to completion due  to spine pain. No axial imaging of the thoracic spine and no postcontrast imaging was obtained. 2. Diffuse profound abnormal bone marrow signal compatible with diffuse skeletal metastatic disease. Mild pathologic fractures re- demonstrated in the lower thoracic and upper lumbar spine. 3. Widespread thoracic and lumbar spinal stenosis but no thoracic cord compression at this time. No thoracic spinal cord edema. 4. Suspicion of intrathecal metastatic disease resulting in diffuse cauda equina thickening.  Assessment/Plan:  MR. Mcnitt 55 y.o man,without any significant past medical history,presented to the history of worsening lower back pain.  Metastatic prostate cancer.He has a new diagnosis of metastatic prostate cancer,with extensive metastases to bone and lymphadenopathy.There was a Questionableable epidural involvement. -Urology consult-they have already seen the patient, we will follow up after the biopsy. -Oncology consult-they saw the patient and recommended to get an MRI of his thoracic and lumbar spine both. And get  CT-guided biopsy. They discussed different options of treatment with the patient, they will start the treatment as an inpatient, before discharge. -MRI of his thoracic and lumbar spine, done. -CT biopsy--done. Waiting for the biopsy results.  Anemia.He was found to have hemoglobin of 4.4.He denies any symptoms.He was given 3 units of packed RBCs.On repeat check his hemoglobin was 8.1. Today it was 7.9. His anemia is most likely due to his diffuse bone metastasis. -  CBC BID to monitor. Transfuse if his hemoglobin drops below 7.  Newly diagnosed diabetes.His recent A1c was 6.7 and his CBG remains between 129-151 -We added sensitive sliding scale for him yesterday.  Elevated liver enzymes. He has mildly elevated liver pain, unsure for the cause. His elevated alkaline phosphatase is most probably due to his bone metastases. -Monitor CMP daily.  Back pain. Most  probably due to his bony metastasis and pathologic fractures. He do have some mild spinal stenosis. -Narco and Tylenol for pain as needed. -PT/ OT  Dispo: Anticipated discharge in approximately 2-3 day(s).   Lorella Nimrod, MD 06/01/2016, 1:11 PM Pager: [phone]

## 2016-06-02 LAB — CBC
HCT: 25.9 % — ABNORMAL LOW (ref 39.0–52.0)
Hemoglobin: 8.2 g/dL — ABNORMAL LOW (ref 13.0–17.0)
MCH: 28.5 pg (ref 26.0–34.0)
MCHC: 31.7 g/dL (ref 30.0–36.0)
MCV: 89.9 fL (ref 78.0–100.0)
PLATELETS: 126 10*3/uL — AB (ref 150–400)
RBC: 2.88 MIL/uL — ABNORMAL LOW (ref 4.22–5.81)
RDW: 18.4 % — AB (ref 11.5–15.5)
WBC: 13.3 10*3/uL — AB (ref 4.0–10.5)

## 2016-06-02 LAB — COMPREHENSIVE METABOLIC PANEL
ALT: 11 U/L — AB (ref 17–63)
AST: 15 U/L (ref 15–41)
Albumin: 2.6 g/dL — ABNORMAL LOW (ref 3.5–5.0)
Alkaline Phosphatase: 2071 U/L — ABNORMAL HIGH (ref 38–126)
Anion gap: 8 (ref 5–15)
BILIRUBIN TOTAL: 1.2 mg/dL (ref 0.3–1.2)
BUN: 8 mg/dL (ref 6–20)
CO2: 27 mmol/L (ref 22–32)
CREATININE: 0.76 mg/dL (ref 0.61–1.24)
Calcium: 8.5 mg/dL — ABNORMAL LOW (ref 8.9–10.3)
Chloride: 101 mmol/L (ref 101–111)
GFR calc Af Amer: >60 mL/min (ref >60)
Glucose, Bld: 160 mg/dL — ABNORMAL HIGH (ref 65–99)
POTASSIUM: 3.7 mmol/L (ref 3.5–5.1)
Sodium: 136 mmol/L (ref 135–145)
TOTAL PROTEIN: 6.4 g/dL — AB (ref 6.5–8.1)

## 2016-06-02 LAB — GLUCOSE, CAPILLARY
GLUCOSE-CAPILLARY: 154 mg/dL — AB (ref 65–99)
GLUCOSE-CAPILLARY: 194 mg/dL — AB (ref 65–99)
GLUCOSE-CAPILLARY: 200 mg/dL — AB (ref 65–99)

## 2016-06-02 MED ORDER — ACETAMINOPHEN 325 MG PO TABS: 650.0000 mg | ORAL_TABLET | Freq: Four times a day (QID) | ORAL | 0 refills | Status: AC | PRN

## 2016-06-02 MED ORDER — BICALUTAMIDE 50 MG PO TABS: 50.0000 mg | ORAL_TABLET | Freq: Every day | ORAL | 0 refills | Status: AC

## 2016-06-02 MED ORDER — ENSURE ENLIVE PO LIQD: 237.0000 mL | Freq: Two times a day (BID) | ORAL | 12 refills | Status: AC

## 2016-06-02 MED ORDER — OXYCODONE-ACETAMINOPHEN 10-325 MG PO TABS: 1.0000 | ORAL_TABLET | ORAL | 0 refills | Status: AC | PRN

## 2016-06-02 MED ORDER — METFORMIN HCL ER 500 MG PO TB24: 500.0000 mg | ORAL_TABLET | Freq: Every day | ORAL | 0 refills | Status: AC

## 2016-06-02 NOTE — Progress Notes (Signed)
CSW met with pt to discuss options for transportation home. Pt reports no options at this time. CSW provided taxi voucher to pt's home.

## 2016-06-02 NOTE — Progress Notes (Signed)
Nsg Discharge Note  Admit Date:  05/29/2016 Discharge date: 06/02/2016   Cameron Marshall to be D/C'd Home per MD order.  AVS completed.  Copy for chart, and copy for patient signed, and dated. Patient/caregiver able to verbalize understanding.  Discharge Medication:   Medication List    STOP taking these medications   diclofenac 75 MG EC tablet Commonly known as:  VOLTAREN     TAKE these medications   acetaminophen 325 MG tablet Commonly known as:  TYLENOL Take 2 tablets (650 mg total) by mouth every 6 (six) hours as needed for mild pain (or Fever >/= 101).   bicalutamide 50 MG tablet Commonly known as:  CASODEX Take 1 tablet (50 mg total) by mouth daily. Start taking on:  06/03/2016   cyclobenzaprine 10 MG tablet Commonly known as:  FLEXERIL Take 1 tablet (10 mg total) by mouth 2 (two) times daily as needed for muscle spasms.   feeding supplement (ENSURE ENLIVE) Liqd Take 237 mLs by mouth 2 (two) times daily between meals.   metFORMIN 500 MG 24 hr tablet Commonly known as:  GLUCOPHAGE XR Take 1 tablet (500 mg total) by mouth daily with breakfast.   oxyCODONE-acetaminophen 10-325 MG tablet Commonly known as:  PERCOCET Take 1 tablet by mouth every 4 (four) hours as needed for pain.       Discharge Assessment: Vitals:   06/02/16 0500 06/02/16 1409  BP: 140/68 134/73  Pulse: 99 (!) 105  Resp: 18 18  Temp: 98.2 F (36.8 C) 98.6 F (37 C)   Skin clean, dry and intact without evidence of skin break down, no evidence of skin tears noted. IV catheter discontinued intact. Site without signs and symptoms of complications - no redness or edema noted at insertion site, patient denies c/o pain - only slight tenderness at site.  Dressing with slight pressure applied.  D/c Instructions-Education: Discharge instructions given to patient/family with verbalized understanding. D/c education completed with patient/family including follow up instructions, medication list, d/c  activities limitations if indicated, with other d/c instructions as indicated by MD - patient able to verbalize understanding, all questions fully answered. Patient instructed to return to ED, call 911, or call MD for any changes in condition.  Patient escorted via [place], and D/C home via private auto.  Salley Slaughter, RN 06/02/2016 2:58 PM  Nsg Discharge Note  Admit Date:  05/29/2016 Discharge date: 06/02/2016   Cameron Marshall to be D/C'd Home per MD order.  AVS completed.  Copy for chart, and copy for patient signed, and dated. Patient/caregiver able to verbalize understanding.  Discharge Medication:   Medication List    STOP taking these medications   diclofenac 75 MG EC tablet Commonly known as:  VOLTAREN     TAKE these medications   acetaminophen 325 MG tablet Commonly known as:  TYLENOL Take 2 tablets (650 mg total) by mouth every 6 (six) hours as needed for mild pain (or Fever >/= 101).   bicalutamide 50 MG tablet Commonly known as:  CASODEX Take 1 tablet (50 mg total) by mouth daily. Start taking on:  06/03/2016   cyclobenzaprine 10 MG tablet Commonly known as:  FLEXERIL Take 1 tablet (10 mg total) by mouth 2 (two) times daily as needed for muscle spasms.   feeding supplement (ENSURE ENLIVE) Liqd Take 237 mLs by mouth 2 (two) times daily between meals.   metFORMIN 500 MG 24 hr tablet Commonly known as:  GLUCOPHAGE XR Take 1 tablet (500 mg total)  by mouth daily with breakfast.   oxyCODONE-acetaminophen 10-325 MG tablet Commonly known as:  PERCOCET Take 1 tablet by mouth every 4 (four) hours as needed for pain.       Discharge Assessment: Vitals:   06/02/16 0500 06/02/16 1409  BP: 140/68 134/73  Pulse: 99 (!) 105  Resp: 18 18  Temp: 98.2 F (36.8 C) 98.6 F (37 C)   Skin clean, dry and intact without evidence of skin break down, no evidence of skin tears noted. IV catheter discontinued intact. Site without signs and symptoms of complications - no redness  or edema noted at insertion site, patient denies c/o pain - only slight tenderness at site.  Dressing with slight pressure applied.  D/c Instructions-Education: Discharge instructions given to patient/family with verbalized understanding. D/c education completed with patient/family including follow up instructions, medication list, d/c activities limitations if indicated, with other d/c instructions as indicated by MD - patient able to verbalize understanding, all questions fully answered. Patient instructed to return to ED, call 911, or call MD for any changes in condition.  Patient escorted via [place], and D/C home via taxi with voucher from [place], [place] 06/02/2016 2:58 PM

## 2016-06-02 NOTE — Discharge Summary (Signed)
Name: Cameron Marshall MRN: [id number] DOB: [birth year] 55 y.o. PCP: No Pcp Per Patient  Date of Admission: 05/29/2016  9:23 AM Date of Discharge: 06/02/2016 Attending Physician: Cameron Falcon, MD  Discharge Diagnosis: 1. Metastatic prostate cancer 2.DM 3. Anemia   Discharge Medications:   Medication List    STOP taking these medications   diclofenac 75 MG EC tablet Commonly known as:  VOLTAREN     TAKE these medications   acetaminophen 325 MG tablet Commonly known as:  TYLENOL Take 2 tablets (650 mg total) by mouth every 6 (six) hours as needed for mild pain (or Fever >/= 101).   bicalutamide 50 MG tablet Commonly known as:  CASODEX Take 1 tablet (50 mg total) by mouth daily. Start taking on:  06/03/2016   cyclobenzaprine 10 MG tablet Commonly known as:  FLEXERIL Take 1 tablet (10 mg total) by mouth 2 (two) times daily as needed for muscle spasms.   feeding supplement (ENSURE ENLIVE) Liqd Take 237 mLs by mouth 2 (two) times daily between meals.   metFORMIN 500 MG 24 hr tablet Commonly known as:  GLUCOPHAGE XR Take 1 tablet (500 mg total) by mouth daily with breakfast.   oxyCODONE-acetaminophen 10-325 MG tablet Commonly known as:  PERCOCET Take 1 tablet by mouth every 4 (four) hours as needed for pain.       Disposition and follow-up:   Cameron Marshall was discharged from [facility] in Stable condition.  At the hospital follow up visit please address:  1.  His blood sugar, and titrate his metformin accordingly.     His back pain. Please check for his anemia    Please address his ability to afford his medicines. Please try to provide any assistance if needed. We did make sure before the discharge that he can afford his bicalutamide, for at least 2 weeks worth of supply. Patient states that he can do so.  2.  Labs / imaging needed at time of follow-up: CBC and CMP  3.  Pending labs/ test needing follow-up: None  Follow-up  Appointments: Follow-up Information    [facility] .   Why:  Call this number; follow the prompts to get a primary care physician. Contact information: [place]. Schedule an appointment as soon as possible for a visit today.   Why:  Please call and make your F/U appointment for next week. Contact information: [address] [place] [phone]       Zola Button, MD .   Specialty:  Oncology Why:  They will call you with appointment. Contact information: [address] [place] [address] [phone]        Nicolette Bang, MD. Schedule an appointment as soon as possible for a visit today.   Specialty:  Urology Why:  Please make your appointment for next month. Contact information: [place] [address] [phone]           Hospital Course by problem list:  [name] 55 y.o man,without any significant past medical history,presented to the history of worsening lower back pain.  Metastatic prostate cancer. On initial imaging he was found to have vertebral compression fractures, multiple bony lesions suspected for metastatic disease. On further investigation to find the primary lesion, he was found to have a PSA above 1400. He underwent  CT-guided biopsy which confirmed diagnosis of metastatic prostate cancer. On further imaging by  MRI, he had extension of his disease to the bladder, his spine with some intrathecal extension, and bilateral lymph node involvement. Urology and oncology was consulted. Oncology started him on antiandrogen therapy with Lupron. And they will make arrangements for him to be seen by Dr. Alen Blew as an outpatient within 1 or 2 weeks, to start chemotherapy. Oncology wants him to take bicalutamide 50 mg daily, to prevent Lupron-induced LH surge and disease flare up. He needs a follow-up appointment with urology in 1 month.  Anemia.He was found to have  hemoglobin of 4.4. On admission.He denies any symptoms.He was given 3 units of packed RBCs.His anemia is most likely due to his diffuse bone metastasis. His hemoglobin on discharge was 8.2.  Newly diagnosed diabetes.His recent A1c was 6.7 and his CBG remains between 154-212, requiring total of 5 units of insulin over 24 hour. We  start him on metformin 500 mg daily on discharge. He will need a follow-up with PCP to adjust his dose.  Elevated liver enzymes.He has elevated liver enzymes,unsure for the cause.His elevated alkaline phosphatase is most probably due to his bone metastases. He has mildly elevated bilirubin but it  seems stable.  Back pain.Most probably due to his bony metastasis and pathologic fractures.He do have some mild spinal stenosis. -Narco and Tylenol for pain as needed. -PT/OT are recommending continuation of PT and OT at home.  Discharge Vitals:   BP 134/73 (BP Location: Right Arm)   Pulse (!) 105   Temp 98.6 F (37 C) (Oral)   Resp 18   Ht 5' 10" (1.778 m)   Wt 209 lb 12.8 oz (95.2 kg)   SpO2 98%   BMI 30.10 kg/m   Pertinent Labs, Studies, and Procedures:  CBC Latest Ref Rng & Units 06/02/2016 06/01/2016 06/01/2016  WBC 4.0 - 10.5 K/uL 13.3(H) 13.2(H) 13.9(H)  Hemoglobin 13.0 - 17.0 g/dL 8.2(L) 8.1(L) 7.9(L)  Hematocrit 39.0 - 52.0 % 25.9(L) 25.6(L) 25.1(L)  Platelets 150 - 400 K/uL 126(L) 124(L) 124(L)   CMP Latest Ref Rng & Units 06/02/2016 06/01/2016 05/31/2016  Glucose 65 - 99 mg/dL 160(H) 168(H) 152(H)  BUN 6 - 20 mg/dL 8 5(L) 7  Creatinine 0.61 - 1.24 mg/dL 0.76 0.82 0.82  Sodium 135 - 145 mmol/L 136 137 136  Potassium 3.5 - 5.1 mmol/L 3.7 3.8 3.7  Chloride 101 - 111 mmol/L 101 102 102  CO2 22 - 32 mmol/L _0 Calcium 8.9 - 10.3 mg/dL 8.5(L) 8.5(L) 8.7(L)  Total Protein 6.5 - 8.1 g/dL 6.4(L) 6.2(L) 6.2(L)  Total Bilirubin 0.3 - 1.2 mg/dL 1.2 1.4(H) 1.4(H)  Alkaline Phos 38 - 126 U/L 2,071(H) 1,895(H) 1,618(H)  AST 15 - 41 U/L 15 19  14(L)  ALT 17 - 63 U/L 11(L) 12(L) 12(L)   PSA:  1471. Kappa/lambda light chains  Order: [id number]  Status:  Final result Visible to patient:  No (Not Released) Next appt:  None   Ref Range & Units 2d ago  Kappa free light chain 3.3 - 19.4 mg/L 63.6    Lamda free light chains 5.7 - 26.3 mg/L 37.6    Kappa, lamda light chain ratio 0.26 - 1.65 1.69    Comments: (NOTE)         Surgical pathology report. Diagnosis 05/31/2016 Lymph node, needle/core biopsy, Left ext iliac LAN - METASTATIC ADENOCARCINOMA, SEE COMMENT. Microscopic Comment The patient's history combined with the morphology are consistent with metastatic prostate cancer. The case was called to Dr. Burr Medico on 06/01/2016.  CT lumbar spine wo contrast. IMPRESSION: 1. Diffusely abnormal visible spine with pathologic fractures throughout the visible lower thoracic spine and involving L1 and L2. Lytic lesions in the L4 and L5 vertebral bodies. Mixed lytic and sclerotic lesions throughout the visible sacrum. 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 2. Bulky prevertebral, retroperitoneal, and left pelvic sidewall lymphadenopathy/tumor. Partially visible prostate appears enlarged. The partially visible kidneys do not appear obstructed. 3. 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 The leading differential considerations are Lymphoma, Prostate cancer, and less likely Multiple Myeloma or other primary. Follow-up CT Chest Abdomen and Pelvis with IV and oral contrast probably is the best next step to evaluate further.  MRI of thoracic or lumbar spine. IMPRESSION: 1. The examination had to be discontinued prior to completion due to spine pain. No axial imaging of the thoracic spine and no postcontrast imaging was obtained. 2. Diffuse profound abnormal bone marrow signal compatible with diffuse skeletal metastatic disease. Mild pathologic fractures  re- demonstrated in the lower thoracic and upper lumbar spine. 3. Widespread thoracic and lumbar spinal stenosis but no thoracic cord compression at this time. No thoracic spinal cord edema. 4. Suspicion of intrathecal metastatic disease resulting in diffuse cauda equina thickening.   Discharge Instructions: Discharge Instructions    Diet - low sodium heart healthy    Complete by:  As directed    Discharge instructions    Complete by:  As directed    It was pleasure taking care of you. Please go for your follow-up appointments at cancer Center as they have told you. Please take your medicines regularly especially Bicalutamide every day. I'm starting you on metformin 500 mg to be taken every day in the morning for your high blood sugar. Please follow-up in internal medicine clinic at [place] is so we can adjust your dose. We will call you with a follow-up appointment. You can take Tylenol or Percocet for your back pain,  every 4 hourly if needed. Try taking one Percocet before participating with physical therapist. We patient best of luck in your journey ahead.   Increase activity slowly    Complete by:  As directed       Signed: Sumayya Amin, MD 06/02/2016, 2:45 PM   Pager: [phone] 

## 2016-06-02 NOTE — Progress Notes (Signed)
Subjective: Patient was feeling better this morning. He states that his pain is under control at this moment. He wants to go home and was asking about the discharge.  Objective:  Vital signs in last 24 hours: Vitals:   06/01/16 1623 06/01/16 2203 06/02/16 0010 06/02/16 0500  BP: (!) 127/55 130/66 132/68 140/68  Pulse: 100 (!) 101 100 99  Resp: 19 18 18 18   Temp: 98.8 F (37.1 C) 99.3 F (37.4 C) 99.2 F (37.3 C) 98.2 F (36.8 C)  TempSrc: Oral Oral Oral Oral  SpO2: 98% 98% 99% 99%  Weight:    209 lb 12.8 oz (95.2 kg)  Height:       Gen.well-developed, well-nourished, in no acute distress. Eyes. Conjunctival pallor and mild scleral icterus. Lungs.Clear bilaterally CV.Regular rate and rhythm Abdomen.Soft, nontender, bowel sounds positive. Extremities.No edema, no cyanosis, pulses 2+ bilaterally  Labs. CBC Latest Ref Rng & Units 06/01/2016 06/01/2016 05/31/2016  WBC 4.0 - 10.5 K/uL 13.2(H) 13.9(H) 14.4(H)  Hemoglobin 13.0 - 17.0 g/dL 8.1(L) 7.9(L) 8.1(L)  Hematocrit 39.0 - 52.0 % 25.6(L) 25.1(L) 25.2(L)  Platelets 150 - 400 K/uL 124(L) 124(L) 123(L)   CMP Latest Ref Rng & Units 06/01/2016 05/31/2016 05/30/2016  Glucose 65 - 99 mg/dL 168(H) 152(H) 181(H)  BUN 6 - 20 mg/dL 5(L) 7 10  Creatinine 0.61 - 1.24 mg/dL 0.82 0.82 0.95  Sodium 135 - 145 mmol/L 137 136 137  Potassium 3.5 - 5.1 mmol/L 3.8 3.7 3.8  Chloride 101 - 111 mmol/L 102 102 104  CO2 22 - 32 mmol/L 27 25 24   Calcium 8.9 - 10.3 mg/dL 8.5(L) 8.7(L) 8.7(L)  Total Protein 6.5 - 8.1 g/dL 6.2(L) 6.2(L) 6.8  Total Bilirubin 0.3 - 1.2 mg/dL 1.4(H) 1.4(H) 1.9(H)  Alkaline Phos 38 - 126 U/L 1,895(H) 1,618(H) 1,372(H)  AST 15 - 41 U/L 19 14(L) 16  ALT 17 - 63 U/L 12(L) 12(L) 11(L)   Surgical pathology report. Diagnosis 05/31/2016 Lymph node, needle/core biopsy, Left ext iliac LAN - METASTATIC ADENOCARCINOMA, SEE COMMENT. Microscopic Comment The patient's history combined with the morphology are consistent  with metastatic prostate cancer. The case was called to Dr. Burr Medico on 06/01/2016.  Assessment/Plan:  Cameron Marshall 55 y.o man,without any significant past medical history,presented to the history of worsening lower back pain.  Metastatic prostate cancer.His pathology results are consistent with metastatic prostate cancer. Dr. Burr Medico from oncology saw him yesterday and recommended to start Lupron 7.5mg  im for androgen deprivation, and bicalutamide 50mg  daily. They are requesting [place] to give him at least 1 month worth of supply of bicalutamide as it is very important to prevent Lupron-induced LH surge and disease flare up. They will make a follow-up appointment with Dr. Alen Blew in 1-2 weeks. They're planning to start him on abiraterone as outpt .  Anemia.He was found to have hemoglobin of 4.4. On admission.He denies any symptoms.He was given 3 units of packed RBCs.His anemia is most likely due to his diffuse bone metastasis. - CBC BID to monitor. Transfuse if his hemoglobin drops below 7.  Newly diagnosed diabetes.His recent A1c was 6.7 and his CBG remains between 154-212, requiring total of 5 units of insulin over the last 24 hours. We will start him on metformin on discharge. He will need a follow-up with PCP to adjust his dose. Continue with sliding scale.  Elevated liver enzymes. He has mildly elevated liver enzymes, unsure for the cause. His elevated alkaline phosphatase is most probably due to his bone metastases. He has mildly  elevated bilirubin but it  seems stable. -Monitor CMP daily.  Back pain. Most probably due to his bony metastasis and pathologic fractures. He do have some mild spinal stenosis. -Narco and Tylenol for pain as needed. -PT/ OT are recommending continuation of PT and OT at home.  Dispo: He will be discharged home later today.  Lorella Nimrod, MD 06/02/2016, 7:49 AM Pager: [phone]

## 2016-06-02 NOTE — Discharge Instructions (Signed)
It was pleasure taking care of you. Please go for your follow-up appointments at [place] as they have told you. Please take your medicines regularly especially Bicalutamide every day. I'm starting you on metformin 500 mg to be taken every day in the morning for your high blood sugar. Please follow-up in internal medicine clinic at [facility] is so we can adjust your dose. We will call you with a follow-up appointment. You can take Tylenol or Percocet for your back pain,  every 4 hourly if needed. Try taking one Percocet before participating with physical therapist. We patient best of luck in your journey ahead.

## 2016-06-02 NOTE — Care Management Note (Addendum)
Case Management Note  Patient Details  Name: Cameron Marshall MRN: [id number] Date of Birth: 1960/12/24  Subjective/Objective:                  lower back pain Action/Plan: Discharge planning Expected Discharge Date:  06/02/16               Expected Discharge Plan:  [place]  In-House Referral:     Discharge planning Services  CM Consult  Post Acute Care Choice:  Home Health Choice offered to:  Patient  DME Arranged:  3-N-1, Walker rolling DME Agency:  [place]:  PT [facility] Agency:  [place]  Status of Service:  Completed, signed off  If discussed at [place] of Stay Meetings, dates discussed:    Additional Comments: CM spoke with pt to offer choice of home health agency.  Pt chooses [facility] to render HHPT/OT.  Referral called to [facility] rep, Jermaine.  CM notified [place] DME rep, Reggie to please deliver the rolling walker and 3n1 to room prior to discharge. CM has placed [facility] NUMBER ON AVS FOR PT TO CALL AND SECURE A pcp. No other CM needs were communicated. Dellie Catholic, RN 06/02/2016, 9:01 AM

## 2016-06-04 ENCOUNTER — Telehealth: Payer: Self-pay | Admitting: Oncology

## 2016-06-04 ENCOUNTER — Encounter: Payer: Self-pay | Admitting: Oncology

## 2016-06-04 NOTE — Telephone Encounter (Signed)
Lf mess for pt regarding scheduling an new pt with Dr. Alen Blew

## 2016-06-04 NOTE — Telephone Encounter (Signed)
Lft vm to let pt know appt has been made for him to see Shadad on Thurs. 11/2 @2pm . Letter mailed to the patient of the appt date and time.

## 2016-06-05 ENCOUNTER — Telehealth: Payer: Self-pay | Admitting: Oncology

## 2016-06-05 NOTE — Telephone Encounter (Signed)
lft vm w/appt date and time.

## 2016-06-05 NOTE — Telephone Encounter (Signed)
Lt mess to reschedule 11/2 appt to 10/31

## 2016-06-07 ENCOUNTER — Telehealth: Payer: Self-pay | Admitting: Oncology

## 2016-06-07 NOTE — Telephone Encounter (Signed)
Lt mess regarding rescheduling appt.

## 2016-06-15 ENCOUNTER — Emergency Department (HOSPITAL_COMMUNITY)
Admission: EM | Admit: 2016-06-15 | Discharge: 2016-06-20 | Disposition: E | Payer: BC Managed Care – PPO | Source: Home / Self Care | Attending: Emergency Medicine | Admitting: Emergency Medicine

## 2016-06-15 ENCOUNTER — Emergency Department (HOSPITAL_COMMUNITY): Payer: BC Managed Care – PPO

## 2016-06-15 ENCOUNTER — Encounter (HOSPITAL_COMMUNITY): Admission: AD | Disposition: E | Payer: Self-pay | Source: Ambulatory Visit | Attending: Interventional Cardiology

## 2016-06-15 ENCOUNTER — Inpatient Hospital Stay (HOSPITAL_COMMUNITY)
Admission: AD | Admit: 2016-06-15 | Discharge: 2016-06-20 | DRG: 286 | Disposition: E | Payer: BC Managed Care – PPO | Source: Ambulatory Visit | Attending: Interventional Cardiology | Admitting: Interventional Cardiology

## 2016-06-15 ENCOUNTER — Encounter (HOSPITAL_COMMUNITY): Payer: Self-pay | Admitting: Radiology

## 2016-06-15 DIAGNOSIS — R402412 Glasgow coma scale score 13-15, at arrival to emergency department: Secondary | ICD-10-CM | POA: Diagnosis present

## 2016-06-15 DIAGNOSIS — G839 Paralytic syndrome, unspecified: Secondary | ICD-10-CM | POA: Diagnosis present

## 2016-06-15 DIAGNOSIS — I469 Cardiac arrest, cause unspecified: Secondary | ICD-10-CM | POA: Diagnosis not present

## 2016-06-15 DIAGNOSIS — E119 Type 2 diabetes mellitus without complications: Secondary | ICD-10-CM | POA: Diagnosis present

## 2016-06-15 DIAGNOSIS — C61 Malignant neoplasm of prostate: Secondary | ICD-10-CM

## 2016-06-15 DIAGNOSIS — J9690 Respiratory failure, unspecified, unspecified whether with hypoxia or hypercapnia: Secondary | ICD-10-CM | POA: Diagnosis present

## 2016-06-15 DIAGNOSIS — Z79899 Other long term (current) drug therapy: Secondary | ICD-10-CM

## 2016-06-15 DIAGNOSIS — F17210 Nicotine dependence, cigarettes, uncomplicated: Secondary | ICD-10-CM | POA: Diagnosis present

## 2016-06-15 DIAGNOSIS — Z7984 Long term (current) use of oral hypoglycemic drugs: Secondary | ICD-10-CM | POA: Diagnosis not present

## 2016-06-15 DIAGNOSIS — Z7401 Bed confinement status: Secondary | ICD-10-CM | POA: Diagnosis not present

## 2016-06-15 DIAGNOSIS — R9431 Abnormal electrocardiogram [ECG] [EKG]: Secondary | ICD-10-CM | POA: Diagnosis not present

## 2016-06-15 DIAGNOSIS — C7951 Secondary malignant neoplasm of bone: Secondary | ICD-10-CM | POA: Diagnosis present

## 2016-06-15 HISTORY — PX: CARDIAC CATHETERIZATION: SHX172

## 2016-06-15 LAB — CBC WITH DIFFERENTIAL/PLATELET
BASOS PCT: 1 %
Basophils Absolute: 0.2 10*3/uL — ABNORMAL HIGH (ref 0.0–0.1)
EOS PCT: 1 %
Eosinophils Absolute: 0.2 10*3/uL (ref 0.0–0.7)
HEMATOCRIT: 25.1 % — AB (ref 39.0–52.0)
HEMOGLOBIN: 7.3 g/dL — AB (ref 13.0–17.0)
LYMPHS PCT: 31 %
Lymphs Abs: 5.2 10*3/uL — ABNORMAL HIGH (ref 0.7–4.0)
MCH: 27.5 pg (ref 26.0–34.0)
MCHC: 29.1 g/dL — ABNORMAL LOW (ref 30.0–36.0)
MCV: 94.7 fL (ref 78.0–100.0)
MONOS PCT: 5 %
Monocytes Absolute: 0.8 10*3/uL (ref 0.1–1.0)
NEUTROS ABS: 10.3 10*3/uL — AB (ref 1.7–7.7)
NEUTROS PCT: 62 %
PLATELETS: 236 10*3/uL (ref 150–400)
RBC: 2.65 MIL/uL — ABNORMAL LOW (ref 4.22–5.81)
RDW: 18.8 % — ABNORMAL HIGH (ref 11.5–15.5)
WBC: 16.7 10*3/uL — ABNORMAL HIGH (ref 4.0–10.5)

## 2016-06-15 LAB — TRIGLYCERIDES: Triglycerides: 113 mg/dL (ref <150)

## 2016-06-15 LAB — BRAIN NATRIURETIC PEPTIDE: B Natriuretic Peptide: 143.5 pg/mL — ABNORMAL HIGH (ref 0.0–100.0)

## 2016-06-15 LAB — PROTIME-INR
INR: 1.67
Prothrombin Time: 19.9 seconds — ABNORMAL HIGH (ref 11.4–15.2)

## 2016-06-15 LAB — APTT: APTT: 40 s — AB (ref 24–36)

## 2016-06-15 SURGERY — LEFT HEART CATH AND CORONARY ANGIOGRAPHY
Anesthesia: LOCAL

## 2016-06-15 MED ORDER — FENTANYL CITRATE (PF) 100 MCG/2ML IJ SOLN: 100.0000 ug | INTRAMUSCULAR | Status: DC | PRN

## 2016-06-15 MED ORDER — IOPAMIDOL (ISOVUE-370) INJECTION 76%
INTRAVENOUS | Status: AC
  Filled 2016-06-15: qty 125

## 2016-06-15 MED ORDER — SODIUM CHLORIDE 0.9 % IV SOLN: INTRAVENOUS | Status: DC

## 2016-06-15 MED ORDER — NOREPINEPHRINE BITARTRATE 1 MG/ML IV SOLN: 2.0000 ug/min | INTRAVENOUS | Status: DC

## 2016-06-15 MED ORDER — INSULIN ASPART 100 UNIT/ML ~~LOC~~ SOLN: 0.0000 [IU] | SUBCUTANEOUS | Status: DC

## 2016-06-15 MED ORDER — NOREPINEPHRINE BITARTRATE 1 MG/ML IV SOLN
INTRAVENOUS | Status: AC
  Filled 2016-06-15: qty 4

## 2016-06-15 MED ORDER — HEPARIN (PORCINE) IN NACL 2-0.9 UNIT/ML-% IJ SOLN
INTRAMUSCULAR | Status: DC | PRN
  Administered 2016-06-15: 1500 mL

## 2016-06-15 MED ORDER — LIDOCAINE HCL (PF) 1 % IJ SOLN
INTRAMUSCULAR | Status: DC | PRN
  Administered 2016-06-15: 15 mL

## 2016-06-15 MED ORDER — IOPAMIDOL (ISOVUE-370) INJECTION 76%
INTRAVENOUS | Status: DC | PRN
  Administered 2016-06-15: 30 mL via INTRA_ARTERIAL

## 2016-06-15 MED ORDER — FAMOTIDINE IN NACL 20-0.9 MG/50ML-% IV SOLN: 20.0000 mg | Freq: Two times a day (BID) | INTRAVENOUS | Status: DC

## 2016-06-15 MED ORDER — ORAL CARE MOUTH RINSE: 15.0000 mL | Freq: Four times a day (QID) | OROMUCOSAL | Status: DC

## 2016-06-15 MED ORDER — MIDAZOLAM HCL 2 MG/2ML IJ SOLN: 2.0000 mg | INTRAMUSCULAR | Status: DC | PRN

## 2016-06-15 MED ORDER — HEPARIN SODIUM (PORCINE) 5000 UNIT/ML IJ SOLN
INTRAMUSCULAR | Status: AC
Start: 2016-06-15 — End: 2016-06-15
  Administered 2016-06-15: 5000 [IU]
  Filled 2016-06-15: qty 1

## 2016-06-15 MED ORDER — NOREPINEPHRINE BITARTRATE 1 MG/ML IV SOLN
INTRAVENOUS | Status: DC | PRN
  Administered 2016-06-15: 10 ug/min via INTRAVENOUS

## 2016-06-15 MED ORDER — IOPAMIDOL (ISOVUE-370) INJECTION 76%
INTRAVENOUS | Status: AC
  Filled 2016-06-15: qty 100

## 2016-06-15 MED ORDER — CHLORHEXIDINE GLUCONATE 0.12% ORAL RINSE (MEDLINE KIT): 15.0000 mL | Freq: Two times a day (BID) | OROMUCOSAL | Status: DC

## 2016-06-15 MED ORDER — LIDOCAINE HCL (PF) 1 % IJ SOLN
INTRAMUSCULAR | Status: AC
  Filled 2016-06-15: qty 30

## 2016-06-15 MED ORDER — HEPARIN (PORCINE) IN NACL 2-0.9 UNIT/ML-% IJ SOLN
INTRAMUSCULAR | Status: AC
  Filled 2016-06-15: qty 1500

## 2016-06-15 MED ORDER — PROPOFOL 1000 MG/100ML IV EMUL: 0.0000 ug/kg/min | INTRAVENOUS | Status: DC

## 2016-06-15 SURGICAL SUPPLY — 11 items
CATH INFINITI 5FR MULTPACK ANG (CATHETERS) ×1 IMPLANT
GUIDEWIRE 3MM J TIP .035 145 (WIRE) ×1 IMPLANT
HOVERMATT SINGLE USE (MISCELLANEOUS) ×1 IMPLANT
KIT ENCORE 26 ADVANTAGE (KITS) ×1 IMPLANT
KIT HEART LEFT (KITS) ×2 IMPLANT
PACK CARDIAC CATHETERIZATION (CUSTOM PROCEDURE TRAY) ×2 IMPLANT
SHEATH PINNACLE 6F 10CM (SHEATH) ×1 IMPLANT
SYR MEDRAD MARK V 150ML (SYRINGE) ×2 IMPLANT
TRANSDUCER W/STOPCOCK (MISCELLANEOUS) ×2 IMPLANT
TUBING CIL FLEX 10 FLL-RA (TUBING) ×2 IMPLANT
VALVE GUARDIAN II ~~LOC~~ HEMO (MISCELLANEOUS) ×1 IMPLANT

## 2016-06-16 LAB — POCT ACTIVATED CLOTTING TIME: ACTIVATED CLOTTING TIME: 186 s

## 2016-06-18 ENCOUNTER — Encounter (HOSPITAL_COMMUNITY): Payer: Self-pay | Admitting: Interventional Cardiology

## 2016-06-19 ENCOUNTER — Encounter (HOSPITAL_COMMUNITY): Payer: Self-pay | Admitting: Interventional Cardiology

## 2016-06-20 ENCOUNTER — Telehealth: Payer: Self-pay

## 2016-06-20 MED FILL — Medication: Qty: 1 | Status: AC

## 2016-06-20 NOTE — ED Provider Notes (Signed)
[place] DEPT Provider Note   CSN: [id number] Arrival date & time:        History   Chief Complaint Chief Complaint  Patient presents with  . Cardiac Arrest  . Code STEMI    HPI Cameron Marshall is a 55 y.o. male.  Patient is a 55 year old male with new diagnosis of metastatic prostate cancer to the spine (wheelchair bound for the past two weeks) who presents today as a post-arrest. EMS reports they were initially called out for him being unresponsive. Per the caregiver the patient was getting transferred and then became unresponsive, possibly apneic though unclear if pulseless at this time. Bystander started compressions and patient became alert and oriented. On EMS arrival he was GCS 15 and was initially going to refuse care, though EMS persuaded him to be transported. While he was getting transferred EMS reports he began to become bradycardic to the 30s and by the time he got to the ambulance he was in PEA and pulseless. CPR started at this time and he got epi x 2 and approximately 5 minutes of CPR, then ROSC was achieved. Following this he reportedly went into what appeared to be V tach with a pulse, so was cardioverted x 1. Multiple EKGs revealed ST elevations in the anteroseptal leads with dynamic changes so he was called in as a Code STEMI.   The history is provided by the EMS personnel. The history is limited by the condition of the patient and the absence of a caregiver. No language interpreter was used.    History reviewed. No pertinent past medical history.  There are no active problems to display for this patient.   No past surgical history on file.     Home Medications    Prior to Admission medications   Not on File    Family History No family history on file.  Social History Social History  Substance Use Topics  . Smoking status: Not on file  . Smokeless tobacco: Not on file  . Alcohol use Not on file     Allergies   Review of patient's  allergies indicates no known allergies.   Review of Systems Review of Systems  Unable to perform ROS: Patient unresponsive     Physical Exam Updated Vital Signs BP (!) 80/53   Pulse 111   Resp 21   SpO2 99%   Physical Exam  Constitutional: He appears well-developed and well-nourished. He is uncooperative. He appears ill. He is intubated.  HENT:  Head: Normocephalic and atraumatic.  Pupils 3 mm and minimally reactive  Eyes:  Mild icteric-appearing sclerae  Neck: Neck supple.  Cardiovascular: Regular rhythm, S1 normal, S2 normal and normal heart sounds.  Tachycardia present.   Pulses:      Radial pulses are 2+ on the right side, and 2+ on the left side.       Femoral pulses are 2+ on the right side, and 2+ on the left side. Pulmonary/Chest: No tachypnea. He is intubated.  Coarse breath sounds bilaterally, breathing spontaneously  Abdominal: Soft. Normal appearance.  Neurological: GCS eye subscore is 1. GCS verbal subscore is 1. GCS motor subscore is 1.  Skin: Skin is warm and dry. Capillary refill takes less than 2 seconds.     ED Treatments / Results  Labs (all labs ordered are listed, but only abnormal results are displayed) Labs Reviewed  CBC WITH DIFFERENTIAL/PLATELET - Abnormal; Notable for the following:       Result Value   WBC  16.7 (*)    RBC 2.65 (*)    Hemoglobin 7.3 (*)    HCT 25.1 (*)    MCHC 29.1 (*)    RDW 18.8 (*)    Neutro Abs 10.3 (*)    Lymphs Abs 5.2 (*)    Basophils Absolute 0.2 (*)    All other components within normal limits  BRAIN NATRIURETIC PEPTIDE - Abnormal; Notable for the following:    B Natriuretic Peptide 143.5 (*)    All other components within normal limits  PROTIME-INR - Abnormal; Notable for the following:    Prothrombin Time 19.9 (*)    All other components within normal limits  APTT - Abnormal; Notable for the following:    aPTT 40 (*)    All other components within normal limits  TRIGLYCERIDES  BLOOD GAS, ARTERIAL    COMPREHENSIVE METABOLIC PANEL  MAGNESIUM  PHOSPHORUS  BLOOD GAS, ARTERIAL  LACTIC ACID, PLASMA  LACTIC ACID, PLASMA  TROPONIN I  TROPONIN I    EKG  EKG Interpretation None       Radiology No results found.  Procedures Procedures (including critical care time)  Medications Ordered in ED Medications  fentaNYL (SUBLIMAZE) injection 100 mcg (not administered)  propofol (DIPRIVAN) 1000 MG/100ML infusion (not administered)  midazolam (VERSED) injection 2 mg (not administered)  famotidine (PEPCID) IVPB 20 mg premix (not administered)  chlorhexidine gluconate (MEDLINE KIT) (PERIDEX) 0.12 % solution 15 mL (not administered)  MEDLINE mouth rinse (not administered)  0.9 %  sodium chloride infusion (not administered)  insulin aspart (novoLOG) injection 0-15 Units (not administered)  iopamidol (ISOVUE-370) 76 % injection (not administered)  norepinephrine (LEVOPHED) 4 mg in dextrose 5 % 250 mL (0.016 mg/mL) infusion (not administered)  heparin 5000 UNIT/ML injection (5,000 Units  Given 2016-06-22 1844)     Initial Impression / Assessment and Plan / ED Course  I have reviewed the triage vital signs and the nursing notes.  Pertinent labs & imaging results that were available during my care of the patient were reviewed by me and considered in my medical decision making (see chart for details).  Clinical Course    Patient presents as post-arrest with concern for anteroseptal ST elevation Mi. Code STEMI called prior to arrival based on multiple EKGs with dynamic changes and ST elevation in anteroseptal leads. On arrival he was intubated with ETT in place, adequate end tidal Co2 and bilateral breath sounds. Was normotensive on arrival without need for pressors and with strong femoral pulses bilaterally, sinus tachycardia. He had already received ASA and was given a heparin bolus. Patient was taken to the cath lab soon after arrival to the ED with concern for STEMI. Also considered PE in  the differential given metastatic cancer and poor mobility over the last two weeks, though ST elevations are concerning for MI. Discussed with cardiology and feel that with his clear EKG changes it is best to proceed to cath lab for [facility].   Final Clinical Impressions(s) / ED Diagnoses   Final diagnoses:  Cardiac arrest [facility])  Respiratory failure [facility])    New Prescriptions New Prescriptions   No medications on file     Harlin Heys, MD Jun 22, 2016 [place], MD 06/20/16 [phone]

## 2016-06-20 NOTE — Progress Notes (Signed)
Report given to [facility] by cath lab. CCMD ordered stat head CT and [facility] Charge Nurse, Idelia Salm, met RT in cath lab to transport Pt to CT. Pt was in NSR and vital signs stable. Pt transported with vent and zoll. Upon entering CT room. Pt lost a pulse and zoll monitor read asystole. Please see code sheet for details. Pt was pronounced dead by CCMD at bedside. Family was at hospital in waiting room when pt coded. After passing pt was transferred to [facility] for family visitation. CCMD addressed pt outcome with family. Charge Nurse escorted family to pt room.

## 2016-06-20 NOTE — Code Documentation (Signed)
Pt transported to cath lab.  

## 2016-06-20 NOTE — Code Documentation (Signed)
Per EMS patient total down time approximately 11 minutes.

## 2016-06-20 NOTE — H&P (Deleted)
  The note originally documented on this encounter has been moved the the encounter in which it belongs.  

## 2016-06-20 NOTE — Code Documentation (Signed)
Pt placed on zoll with cath lab pads. Per MD Ralene Bathe patient may be candidate for cooling, iced saline started and bags of ice applied.

## 2016-06-20 NOTE — H&P (Signed)
55 y/o man admitted after cardiac arrest. Hx of newly diagnosed CA w/ involvement of spine and paralysis. He tried to move this AM, developed syncope and arrested. EMS was called and CPR was done, ROSC achieved and was taken to cath lab. No acute lesions found. He was taken to CT for emergent CTA for possible PE, but he arrested. Code was started, including tx with 40 mg of tenecteplase. After 30 minutes the code was called and the patient pronounced dead.  CRITICAL CARE Performed by: Luz Brazen   Total critical care time: 45 minutes  Critical care time was exclusive of separately billable procedures and treating other patients.  Critical care was necessary to treat or prevent imminent or life-threatening deterioration.  Critical care was time spent personally by me on the following activities: development of treatment plan with patient and/or surrogate as well as nursing, discussions with consultants, evaluation of patient's response to treatment, examination of patient, obtaining history from patient or surrogate, ordering and performing treatments and interventions, ordering and review of laboratory studies, ordering and review of radiographic studies, pulse oximetry and re-evaluation of patient's condition.

## 2016-06-20 NOTE — Discharge Summary (Deleted)
  The note originally documented on this encounter has been moved the the encounter in which it belongs.  

## 2016-06-20 NOTE — ED Notes (Signed)
EKG given to Dr. Rees 

## 2016-06-20 NOTE — Telephone Encounter (Signed)
Death Certificate received in office. After looking over it and reviewing the chart Dr.Varanasi is not the signing physician. I called and spoke with [place] made them aware.  Death Certificate has been discarded of.

## 2016-06-20 NOTE — Code Documentation (Signed)
Pt to ER BIB [facility] from home as post arrest from home where care giver called out for unresponsive/syncopal episode. Per care giver patient has been non-ambulatory for 2 weeks, pt is spinal cancer patient. Upon arrival EMS hooked patient up to monitor found patient to meet STEMI criteria but was alert and awake, able to chew 324 mg aspirin. EMS went to move patient to stretcher, patient then went SB at 30, into pulseless V-tach, CPR initiated, epi x2, V-tach with a pulse, cardioverted at 100 joules with ROSC. Pt arrives with 7.0 ET tube in place, strong pulses present. ST on the monitor.

## 2016-06-20 NOTE — Discharge Summary (Signed)
See H&P for complete documentation. Patient was being worked up for admission when he arrested and died.  Luz Brazen, MD Pulmonary & Critical Care Medicine June 16, 2016, 2:58 AM

## 2016-06-20 NOTE — Consult Note (Signed)
CARDIOLOGY CONSULT NOTE      Patient ID: Cameron Marshall MRN: [id number] DOB/AGE: 1960/10/04 55 y.o.  Admit date: 07-07-2016 Referring Physician: Dr. Ayesha Rumpf, [facility] Primary Physician Primary Cardiologist : new Reason for Consultation: cardiac arrest  HPI: 55 y/o man with metastatic prostate cancer.  He has been bedridden for 2 weeks.  He apparently called EMS due to chest discomfort. When EMS arrived, he was given aspirin. He was awake and alert at that time. When he transferred to a stretcher, he became unresponsive. At that point, he required CPR. He arrested again and had VF. He was successfully defibrillated.  The most accurate history will be from the ER resident's note and she spoke directly to EMS.  Initial ECG was concerning for anterior ST elevation MI. He was brought emergently to the cardiac Cath Lab. He had nonobstructive disease noted on cardiac cath with hyperdynamic LV function. There appeared to be some subvalvular hypertrophy. 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 The RCA was nondominant.  I discussed the case with Dr. Halford Chessman from pulmonary critical care.  It seemed that pulmonary embolus was the most likely diagnosis. He will be going for emergent CT scan. The patient was started on Levophed in the Cath Lab. He was increased to 30 mcgs. He then had a significant improvement blood pressure. His Levophed was weaned down before leaving the cardiac Cath Lab to the ICU.  Review of systems complete and found to be negative unless listed above   Past Medical History:  Diagnosis Date  . Anemia   . Heart murmur    "sometimes" (05/29/2016)  . History of blood transfusion 05/29/2016   anemia  . Type II diabetes mellitus ([place])     No family history on file.  Social History   Social History  . Marital status: Single    Spouse name: N/A  . Number of children: N/A  . Years of education: N/A   Occupational History  . Not on file.   Social History Main Topics  .  Smoking status: Current Every Day Smoker    Packs/day: 1.00    Years: 39.00  . Smokeless tobacco: Never Used  . Alcohol use Yes     Comment: 05/29/2016 "none since age 60"  . Drug use: No  . Sexual activity: Not Currently   Other Topics Concern  . Not on file   Social History Narrative  . No narrative on file    Past Surgical History:  Procedure Laterality Date  . NO PAST SURGERIES       Prescriptions Prior to Admission  Medication Sig Dispense Refill Last Dose  . acetaminophen (TYLENOL) 325 MG tablet Take 2 tablets (650 mg total) by mouth every 6 (six) hours as needed for mild pain (or Fever >/= 101). 90 tablet 0   . bicalutamide (CASODEX) 50 MG tablet Take 1 tablet (50 mg total) by mouth daily. 30 tablet 0   . cyclobenzaprine (FLEXERIL) 10 MG tablet Take 1 tablet (10 mg total) by mouth 2 (two) times daily as needed for muscle spasms. 20 tablet 0 05/29/2016 at Unknown time  . feeding supplement, ENSURE ENLIVE, (ENSURE ENLIVE) LIQD Take 237 mLs by mouth 2 (two) times daily between meals. 237 mL 12   . metFORMIN (GLUCOPHAGE XR) 500 MG 24 hr tablet Take 1 tablet (500 mg total) by mouth daily with breakfast. 30 tablet 0   . oxyCODONE-acetaminophen (PERCOCET) 10-325 MG tablet Take 1 tablet by mouth every 4 (four) hours  as needed for pain. 60 tablet 0     Physical Exam: Vitals:   Vitals:   2016-06-26 1907  SpO2: 99%   I&O's:  No intake or output data in the 24 hours ending June 26, 2016 1940 Physical exam: Intubated,  [place]/AT EOMI No JVD, No carotid bruit RRR S1S2  Coarse breath sound bilaterally Soft. NT, nondistended No edema. 1+ right radial pulse, 3 + right femoral pulse No focal motor or sensory deficits Normal affect Skin breakdown on back Labs:   Lab Results  Component Value Date   WBC 13.3 (H) 06/02/2016   HGB 8.2 (L) 06/02/2016   HCT 25.9 (L) 06/02/2016   MCV 89.9 06/02/2016   PLT 126 (L) 06/02/2016   No results for input(s): NA, K, CL, CO2, BUN, CREATININE,  CALCIUM, PROT, BILITOT, ALKPHOS, ALT, AST, GLUCOSE in the last 168 hours.  Invalid input(s): LABALBU No results found for: CKTOTAL, CKMB, CKMBINDEX, TROPONINI No results found for: CHOL No results found for: HDL No results found for: LDLCALC No results found for: TRIG No results found for: CHOLHDL No results found for: LDLDIRECT    Radiology: not available EKG: sinus tach, RBBB  ASSESSMENT AND PLAN:  Active Problems:   Cardiac arrest [facility])  I personally reviewed the history and ECG and made the decision to bring him to the Cath Lab. Difficult situation given his metastatic prostate cancer. No family was around. There was no mention of palliative care or DNR in the chart. No significant coronary artery disease. Continue workup for possible PE or sepsis.  Further management per critical care.  Goals of care and CODE STATUS will have to be discussed with family when they arrive.  Levophed for hypotension at this point.  Neo-Synephrine may be another option if this turns out to be sepsis. Given that there was concern for pulmonary embolus, did not perform right heart catheterization. Signed:   Mina Marble, MD, [facility] 2016-06-26, 7:40 PM

## 2016-06-20 NOTE — Progress Notes (Signed)
   Met w/ friend @ ED waiting room.   Introduced Art gallery manager  Escorted to lab -> 2H.  Contacted family via phone (20:00).   Will follow, as needed.   - Rev. Menno MDiv ThM

## 2016-06-20 DEATH — deceased

## 2016-06-21 ENCOUNTER — Ambulatory Visit: Payer: BC Managed Care – PPO | Admitting: Oncology

## 2018-02-06 IMAGING — CT CT ABD-PELV W/ CM
2 of 5 series · 12 of 36 positions shown, 15 images · IV contrast (Omni 300)
Comparison: CT of the lumbar spine performed earlier today at [DATE]
p.m.

CLINICAL DATA: Diffuse metastatic bony disease noted on lumbar
spine CT. Further evaluation requested, to determine source of
malignancy. Initial encounter.

EXAM:
CT CHEST, ABDOMEN, AND PELVIS WITH CONTRAST
TECHNIQUE: Multidetector CT imaging of the chest, abdomen and pelvis was
performed following the standard protocol during bolus
administration of intravenous contrast.
CONTRAST:  100mL [id number] IOPAMIDOL (BP2WNU-F44) INJECTION 61%

[Series 2: cap with 5mm st · axial · 0.97mm/px · z∈[+813,+1333]mm · 9 of 126 slices shown, 12 images]
[im 11/126  mediastinal]
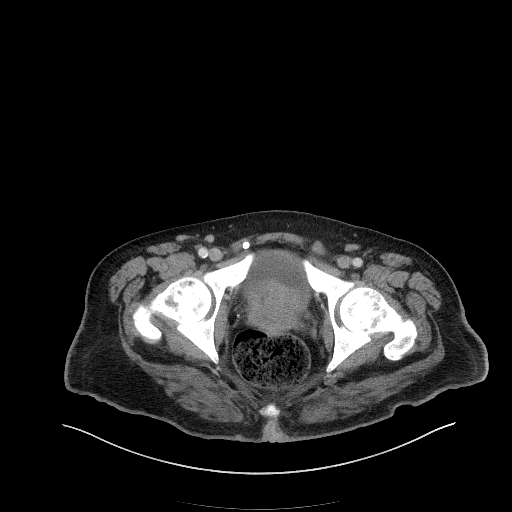
[im 11/126  lung]
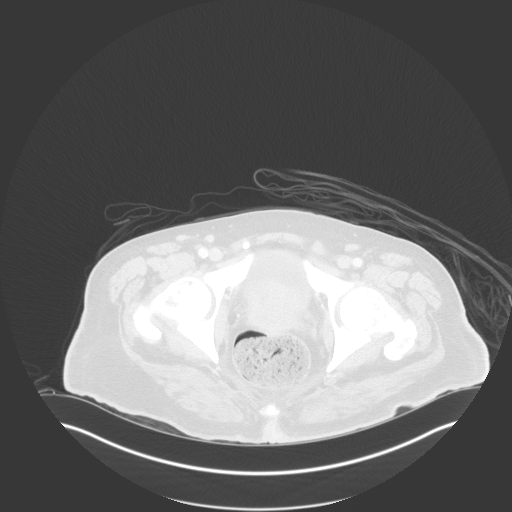
[im 21/126  lung]
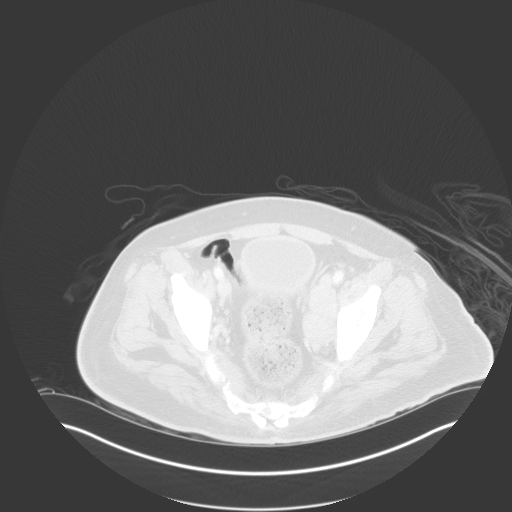
[im 42/126  lung]
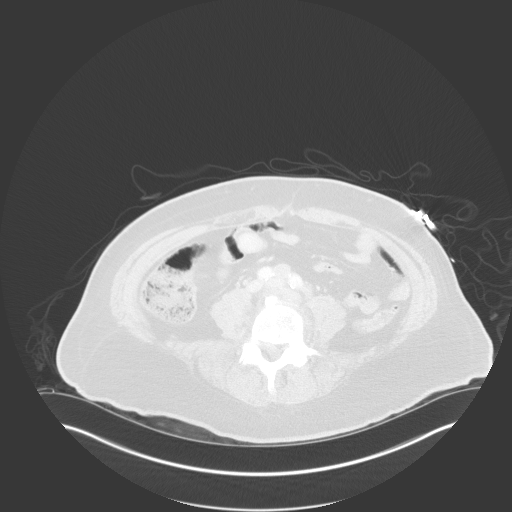
[im 53/126  lung]
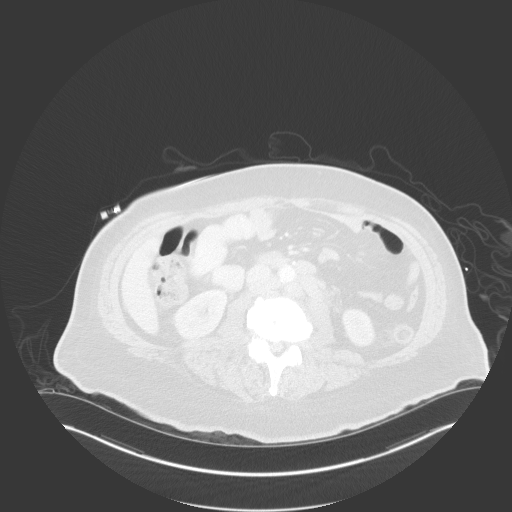
[im 63/126  mediastinal]
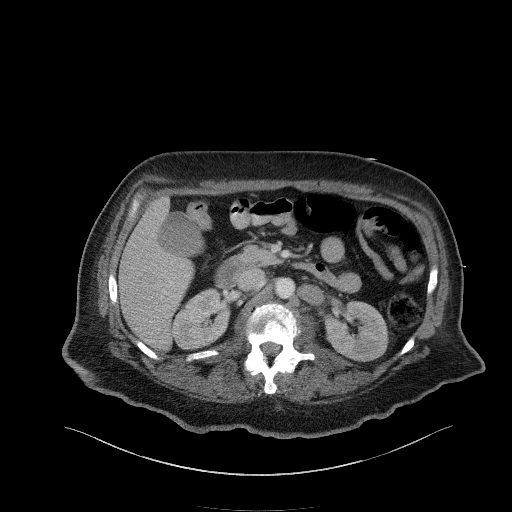
[im 63/126  lung]
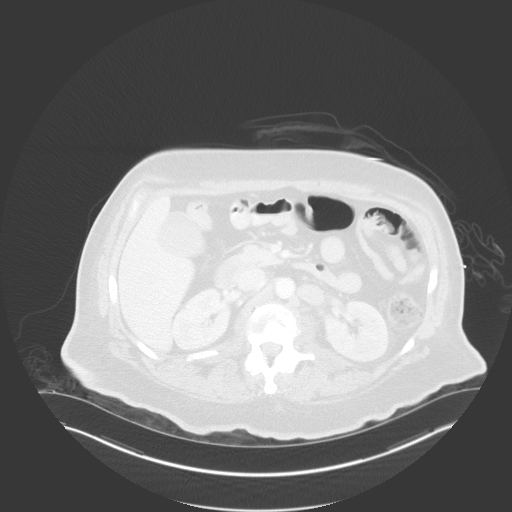
[im 73/126  lung]
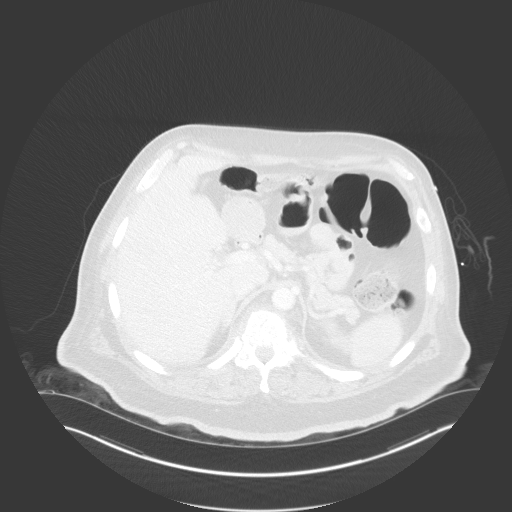
[im 84/126  lung]
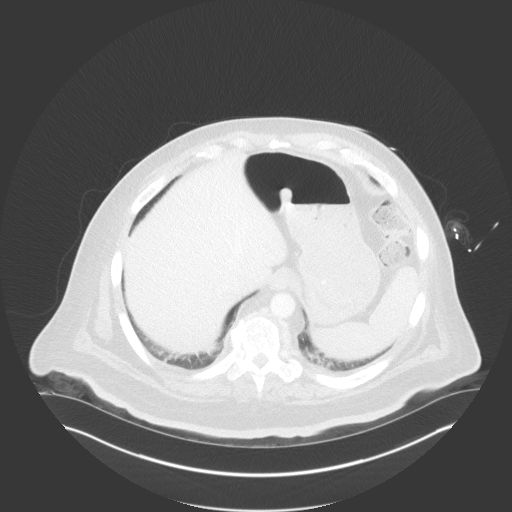
[im 105/126  lung]
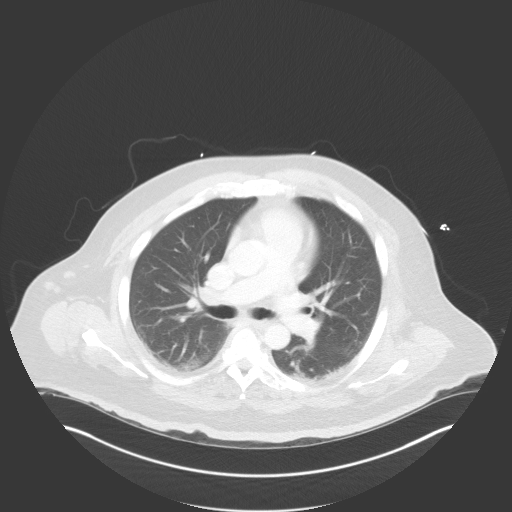
[im 115/126  mediastinal]
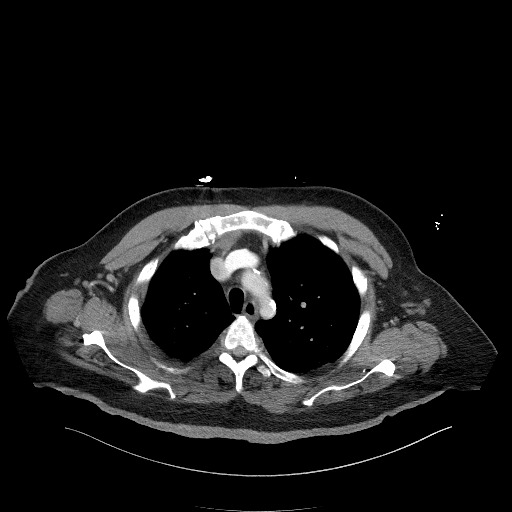
[im 115/126  lung]
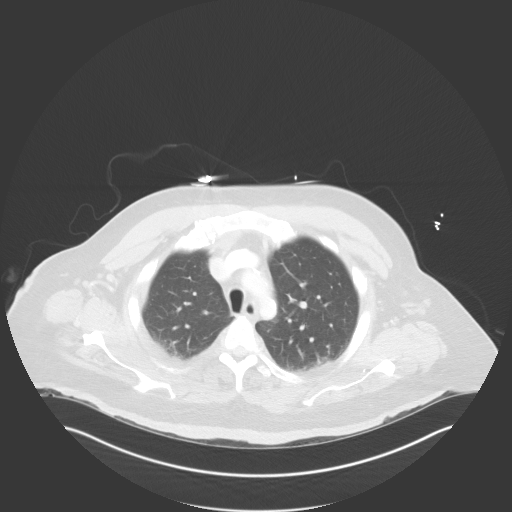

[Series 5: cap with 3mm st cor · coronal · 0.73mm/px · 3 of 139 slices shown]
[im 28/139  lung]
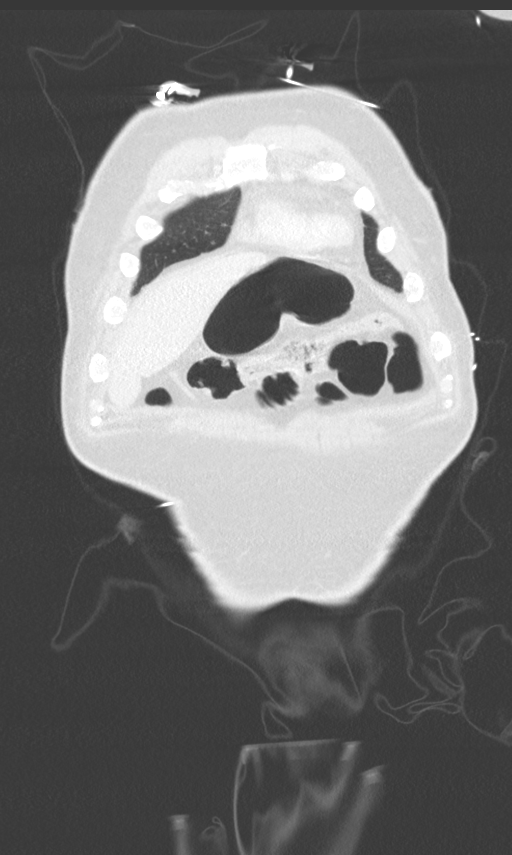
[im 56/139  lung]
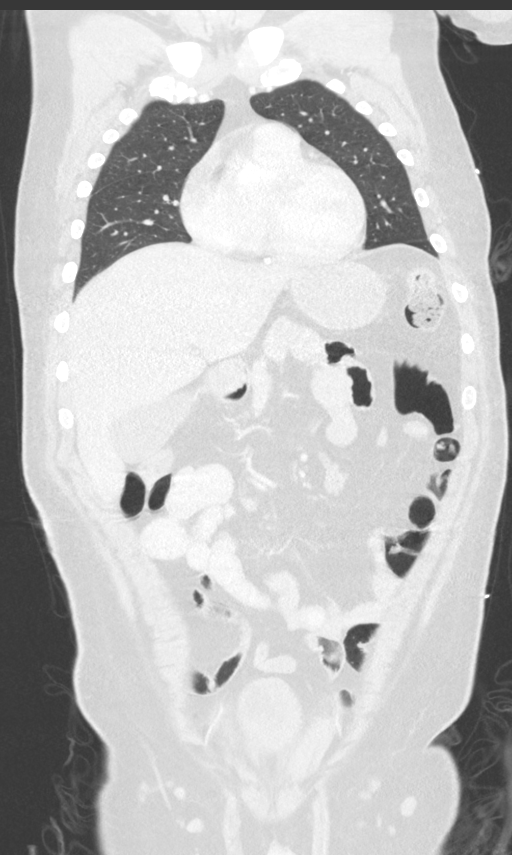
[im 83/139  lung]
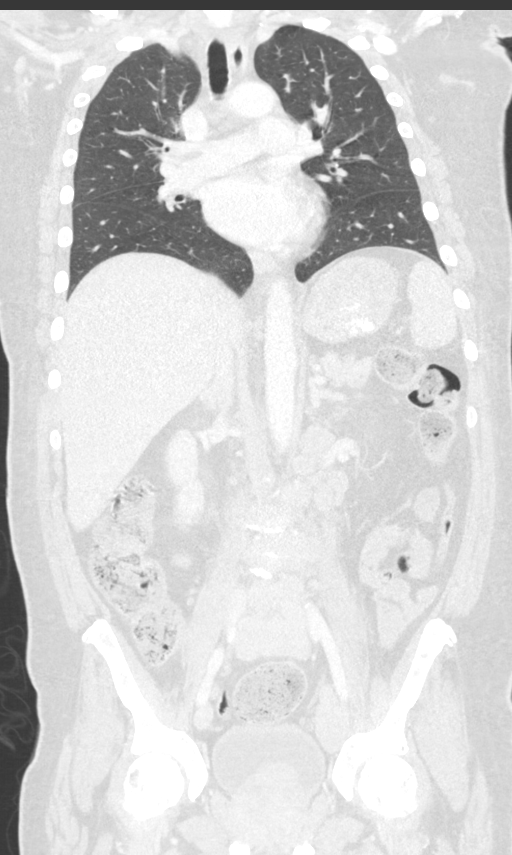

[12 of 36 positions shown; findings below may reference images not displayed]

FINDINGS: CT CHEST FINDINGS

Cardiovascular: Scattered calcification is noted along the aortic
arch and descending thoracic aorta. The heart is unremarkable in
appearance. The great vessels are within normal limits.

Mediastinum/Nodes: The mediastinum is unremarkable in appearance. No
mediastinal lymphadenopathy is seen. No pericardial effusion is
seen. The visualized portions of the thyroid gland are unremarkable
in appearance. No axillary lymphadenopathy is seen.

Lungs/Pleura: Mild bibasilar atelectasis is noted. The lungs are
otherwise grossly clear. No pleural effusion or pneumothorax is
seen. No masses are identified.

Musculoskeletal: No acute osseous abnormalities are identified. The
visualized musculature is unremarkable in appearance.

CT ABDOMEN PELVIS FINDINGS

Hepatobiliary: The liver is unremarkable in appearance. The
gallbladder is unremarkable in appearance. The common bile duct
remains normal in caliber.

Pancreas: The pancreas is within normal limits.

Spleen: The spleen is unremarkable in appearance.

Adrenals/Urinary Tract: The adrenal glands are unremarkable in
appearance. The kidneys are within normal limits. There is no
evidence of hydronephrosis. No renal or ureteral stones are
identified. No perinephric stranding is seen.

Stomach/Bowel: The stomach is unremarkable in appearance. The small
bowel is within normal limits. The appendix is normal in caliber,
without evidence of appendicitis. The colon is unremarkable in
appearance.

Vascular/Lymphatic: Mild scattered calcification is noted along the
distal abdominal aorta and its branches.

Scattered enlarged nodes are noted adjacent to the proximal
abdominal aorta under the diaphragmatic crura.

Enlarged retroperitoneal nodes are seen, surrounding the aorta and
inferior vena cava, with some degree of mass effect on the inferior
vena cava. These nodes measure up to 2.4 cm in short axis. Enlarged
nodes also extend inferiorly from the aortic bifurcation, measuring
up to 2.5 cm in short axis anterior to the lower lumbar spine.

There are also enlarged left-sided pelvic sidewall nodes, measuring
up to 3.1 cm in short axis.

Reproductive: The prostate is enlarged, measuring up to 6.4 cm in AP
dimension, with diffuse nodularity and extension into the base of
the bladder. Findings are compatible with metastatic prostate
cancer.

The bladder is mildly distended and grossly unremarkable in
appearance.

Other: No additional soft tissue abnormalities are seen.

Musculoskeletal: Diffuse metastatic disease is noted throughout the
visualized osseous structures, with a mixed lytic and sclerotic
appearance. There is associated slight compression deformity at the
superior endplates of T12, L1 and L2.
IMPRESSION: 1. Diffusely enlarged and irregular appearing prostate, measuring up
to 6.4 cm in AP dimension, with nodularity and extension into the
base of the bladder. Findings compatible with metastatic prostate
cancer.
2. Enlarged left pelvic sidewall nodes measure up to 3.1 cm in short
axis, reflecting metastatic disease.
3. Enlarged retroperitoneal nodes, extending about the aorta and
inferior vena cava, and inferiorly from the aortic bifurcation,
measuring up to 2.5 cm in short axis.
4. Enlarged nodes noted adjacent to the proximal abdominal aorta
under the diaphragmatic crura.
5. Diffuse metastatic disease throughout the visualized osseous
structures, with a mixed lytic and sclerotic appearance. Associated
slight compression deformity at the superior endplates of T12, L1
and L2.
6. Mild bibasilar atelectasis noted.  Lungs otherwise clear.
7. Mild aortic atherosclerosis noted.

## 2018-02-08 IMAGING — CT CT BIOPSY
1 of 2 series · 15 of 32 positions shown, 19 images · non-contrast
Comparison: none

CLINICAL DATA: Retroperitoneal and left iliac adenopathy. Osseous
metastatic disease.

[Series 2: i-spiral 5.0 b40f · axial · 0.82mm/px · z∈[+979,+1157]mm · 15 of 57 slices shown, 19 images]
[im 3/57  soft-tissue]
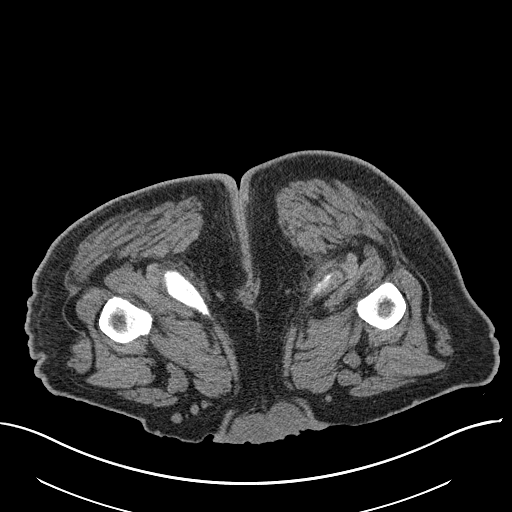
[im 3/57  bone]
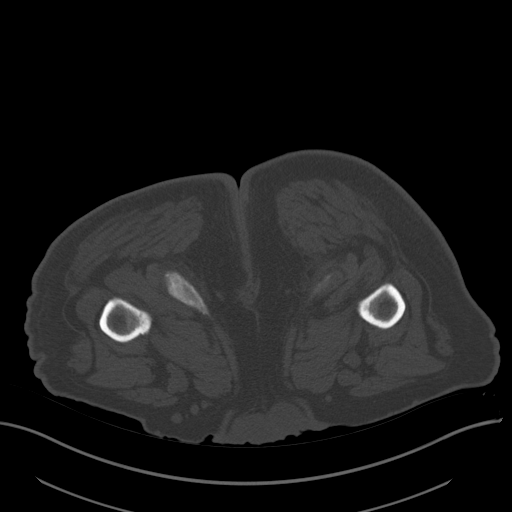
[im 8/57  soft-tissue]
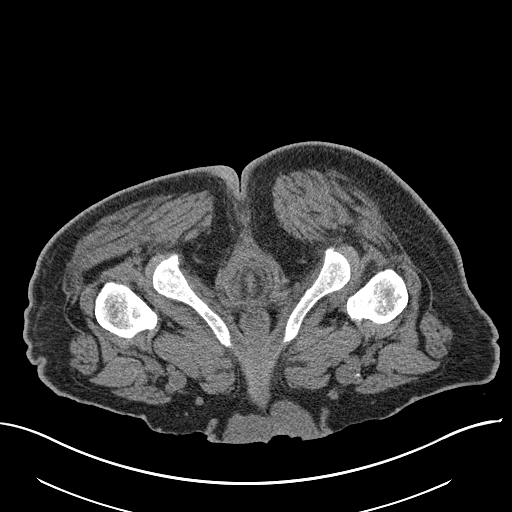
[im 13/57  soft-tissue]
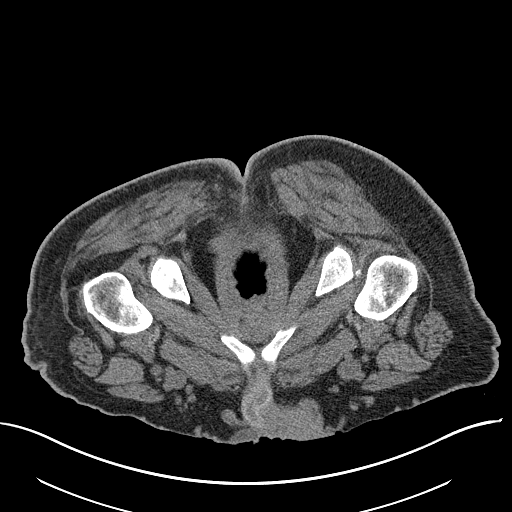
[im 16/57  soft-tissue]
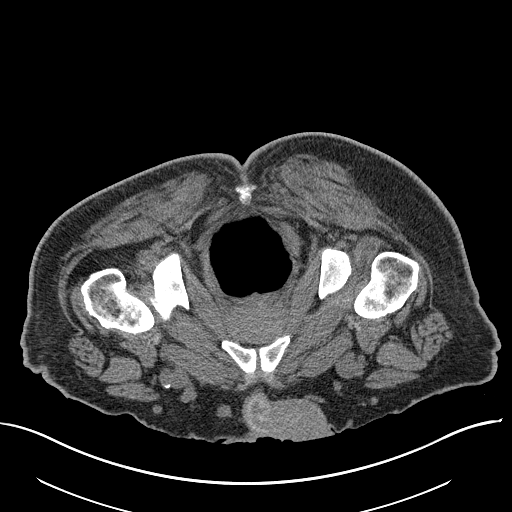
[im 21/57  soft-tissue]
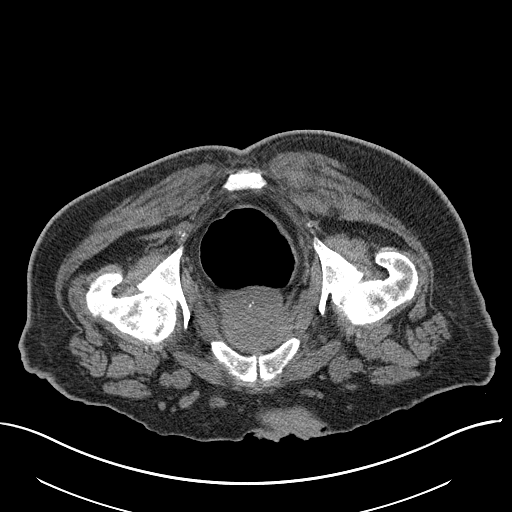
[im 23/57  soft-tissue]
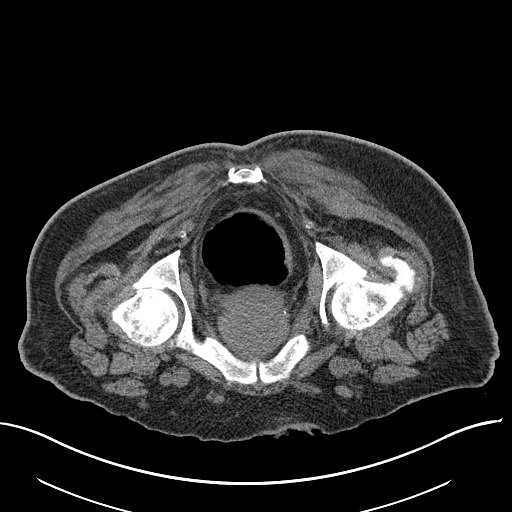
[im 29/57  soft-tissue]
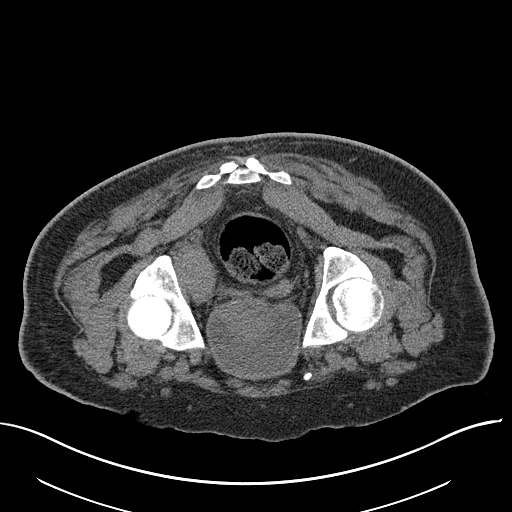
[im 34/57  soft-tissue]
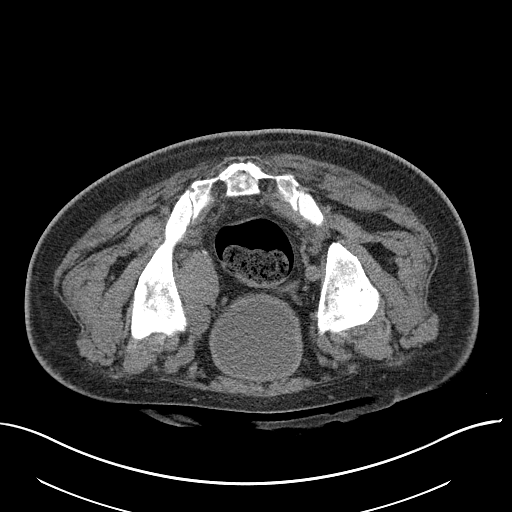
[im 36/57  soft-tissue]
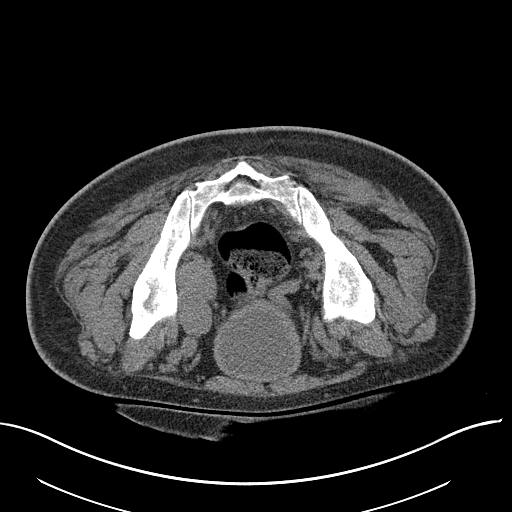
[im 36/57  bone]
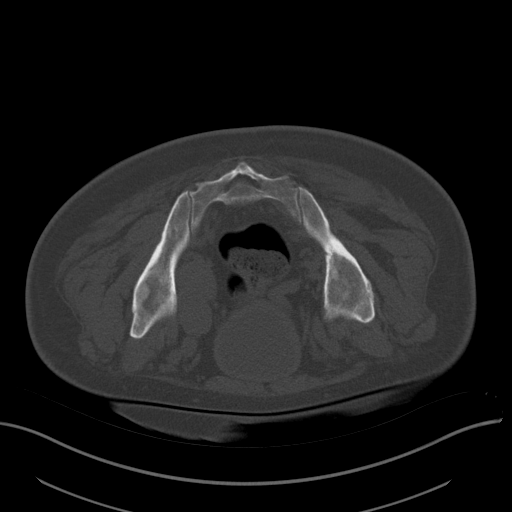
[im 41/57  soft-tissue]
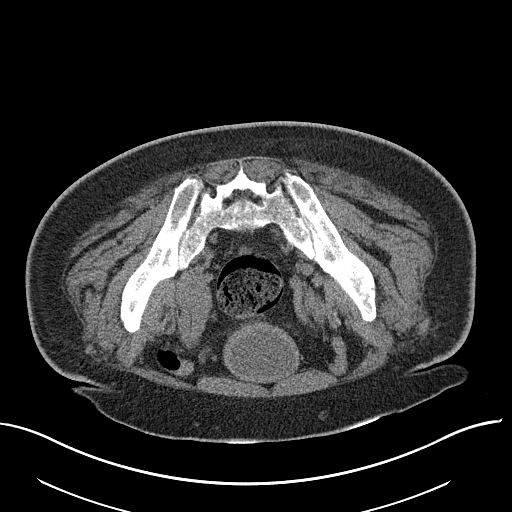
[im 44/57  soft-tissue]
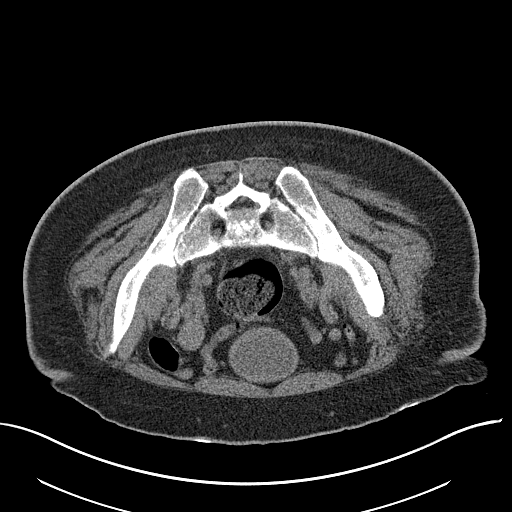
[im 46/57  lung]
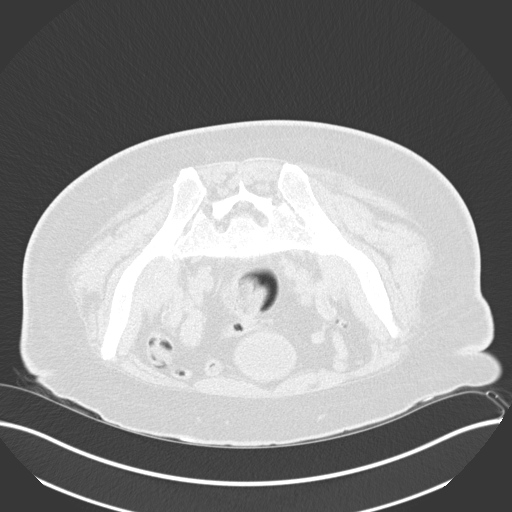
[im 49/57  soft-tissue]
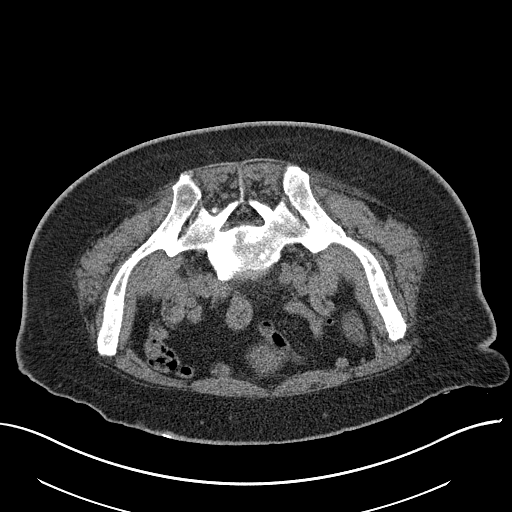
[im 49/57  lung]
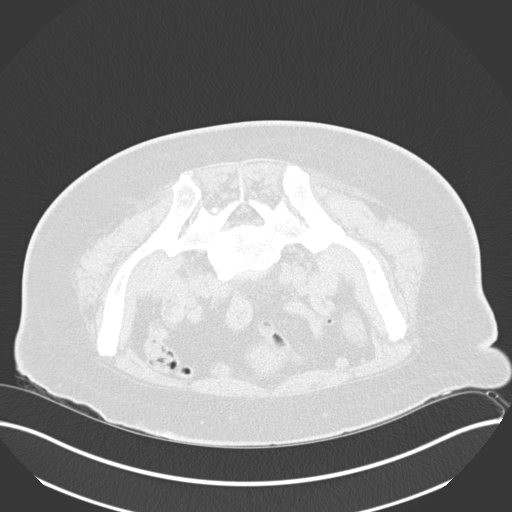
[im 51/57  lung]
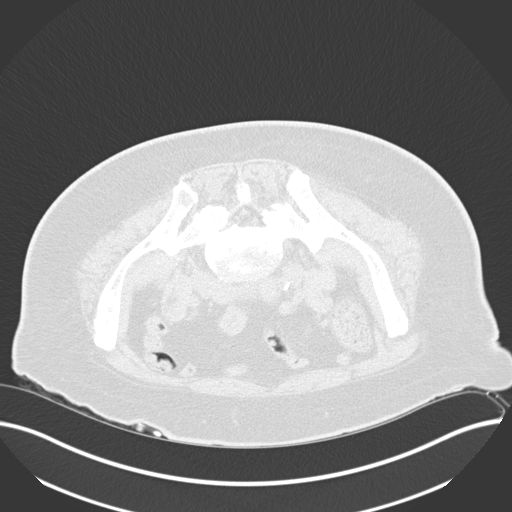
[im 54/57  soft-tissue]
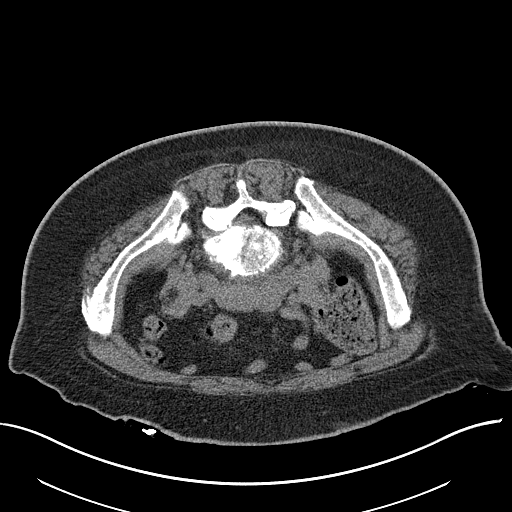
[im 54/57  lung]
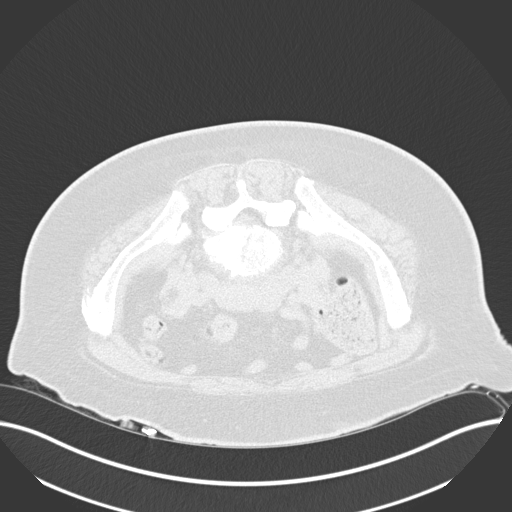

[15 of 32 positions shown; findings below may reference images not displayed]

EXAM:
CT GUIDED CORE BIOPSY OF LEFT INTERNAL ILIAC ADENOPATHY

ANESTHESIA/SEDATION:
Intravenous Fentanyl and Versed were administered as conscious
sedation during continuous monitoring of the patient's level of
consciousness and physiological / cardiorespiratory status by the
radiology RN, with a total moderate sedation time of 10 minutes.

PROCEDURE:
The procedure risks, benefits, and alternatives were explained to
the patient. Questions regarding the procedure were encouraged and
answered. The patient understands and consents to the procedure.

Patient placed prone. Select axial scans through the pelvis
obtained. Representative left internal iliac adenopathy was
localized and an appropriate skin entry site determined and marked.

The operative field was prepped with chlorhexidinein a sterile
fashion, and a sterile drape was applied covering the operative
field. A sterile gown and sterile gloves were used for the
procedure. Local anesthesia was provided with 1% Lidocaine.

Under CT fluoroscopic guidance, a 17 gauge trocar needle was
advanced to the margin of the lesion. Once needle tip position was
confirmed, coaxial 18-gauge core biopsy samples were obtained,
submitted in saline to surgical pathology. The guide needle was
removed. Postprocedure scans show no hemorrhage or other apparent
complication. The patient tolerated the procedure well.

COMPLICATIONS:
None immediate
FINDINGS: Bulky bilateral common iliac in bilateral internal iliac adenopathy
was again identified. Representative CT-guided core biopsy of left
internal iliac adenopathy.
IMPRESSION: 1. Technically successful CT-guided left iliac adenopathy core
biopsy.
